# Patient Record
Sex: Male | Born: 1965 | Race: White | Hispanic: No | Marital: Married | Smoking: Never smoker
Health system: Southern US, Community
[De-identification: ages and names within clinical notes are randomized; demographics above are authoritative.]

## PROBLEM LIST (undated history)

## (undated) DIAGNOSIS — I10 Essential (primary) hypertension: Secondary | ICD-10-CM

## (undated) DIAGNOSIS — E785 Hyperlipidemia, unspecified: Secondary | ICD-10-CM

## (undated) DIAGNOSIS — I214 Non-ST elevation (NSTEMI) myocardial infarction: Secondary | ICD-10-CM

## (undated) DIAGNOSIS — I2510 Atherosclerotic heart disease of native coronary artery without angina pectoris: Secondary | ICD-10-CM

## (undated) DIAGNOSIS — R011 Cardiac murmur, unspecified: Secondary | ICD-10-CM

## (undated) HISTORY — PX: TONSILLECTOMY: SUR1361

## (undated) HISTORY — DX: Non-ST elevation (NSTEMI) myocardial infarction: I21.4

---

## 2014-06-01 DIAGNOSIS — E785 Hyperlipidemia, unspecified: Secondary | ICD-10-CM

## 2014-06-01 HISTORY — DX: Hyperlipidemia, unspecified: E78.5

## 2014-06-26 DIAGNOSIS — I214 Non-ST elevation (NSTEMI) myocardial infarction: Secondary | ICD-10-CM

## 2014-06-26 HISTORY — DX: Non-ST elevation (NSTEMI) myocardial infarction: I21.4

## 2014-06-27 ENCOUNTER — Inpatient Hospital Stay (HOSPITAL_COMMUNITY)
Admission: AD | Admit: 2014-06-27 | Discharge: 2014-06-30 | DRG: 282 | Disposition: A | Discharge status: Home / Self Care | Payer: Managed Care, Other (non HMO) | Source: Other Acute Inpatient Hospital | Attending: Cardiology | Admitting: Cardiology

## 2014-06-27 ENCOUNTER — Encounter (HOSPITAL_COMMUNITY): Payer: Self-pay | Admitting: General Practice

## 2014-06-27 DIAGNOSIS — R011 Cardiac murmur, unspecified: Secondary | ICD-10-CM | POA: Diagnosis present

## 2014-06-27 DIAGNOSIS — I214 Non-ST elevation (NSTEMI) myocardial infarction: Principal | ICD-10-CM | POA: Diagnosis present

## 2014-06-27 DIAGNOSIS — I2510 Atherosclerotic heart disease of native coronary artery without angina pectoris: Secondary | ICD-10-CM | POA: Diagnosis present

## 2014-06-27 DIAGNOSIS — I252 Old myocardial infarction: Secondary | ICD-10-CM | POA: Diagnosis not present

## 2014-06-27 DIAGNOSIS — E875 Hyperkalemia: Secondary | ICD-10-CM | POA: Diagnosis present

## 2014-06-27 DIAGNOSIS — N289 Disorder of kidney and ureter, unspecified: Secondary | ICD-10-CM | POA: Diagnosis present

## 2014-06-27 DIAGNOSIS — E785 Hyperlipidemia, unspecified: Secondary | ICD-10-CM | POA: Diagnosis present

## 2014-06-27 DIAGNOSIS — I10 Essential (primary) hypertension: Secondary | ICD-10-CM | POA: Diagnosis present

## 2014-06-27 DIAGNOSIS — Z7982 Long term (current) use of aspirin: Secondary | ICD-10-CM | POA: Diagnosis not present

## 2014-06-27 DIAGNOSIS — R079 Chest pain, unspecified: Secondary | ICD-10-CM | POA: Diagnosis present

## 2014-06-27 HISTORY — DX: Essential (primary) hypertension: I10

## 2014-06-27 HISTORY — DX: Cardiac murmur, unspecified: R01.1

## 2014-06-27 HISTORY — DX: Atherosclerotic heart disease of native coronary artery without angina pectoris: I25.10

## 2014-06-27 HISTORY — DX: Hyperlipidemia, unspecified: E78.5

## 2014-06-27 LAB — MRSA PCR SCREENING: MRSA by PCR: NEGATIVE

## 2014-06-27 MED ORDER — ONDANSETRON HCL 4 MG/2ML IJ SOLN: 4.0000 mg | Freq: Four times a day (QID) | INTRAMUSCULAR | Status: DC | PRN

## 2014-06-27 MED ORDER — ACETAMINOPHEN 325 MG PO TABS: 650.0000 mg | ORAL_TABLET | ORAL | Status: DC | PRN

## 2014-06-27 MED ORDER — HEPARIN (PORCINE) IN NACL 100-0.45 UNIT/ML-% IJ SOLN
1500.0000 [IU]/h | INTRAMUSCULAR | Status: DC
  Administered 2014-06-28: 1250 [IU]/h via INTRAVENOUS
  Filled 2014-06-27 (×2): qty 250

## 2014-06-27 MED ORDER — METOPROLOL TARTRATE 12.5 MG HALF TABLET
12.5000 mg | ORAL_TABLET | Freq: Two times a day (BID) | ORAL | Status: DC
  Administered 2014-06-28 – 2014-06-30 (×6): 12.5 mg via ORAL
  Filled 2014-06-27 (×6): qty 1

## 2014-06-27 MED ORDER — SODIUM CHLORIDE 0.9 % IV SOLN
INTRAVENOUS | Status: DC
  Administered 2014-06-28: via INTRAVENOUS

## 2014-06-27 MED ORDER — ATORVASTATIN CALCIUM 80 MG PO TABS
80.0000 mg | ORAL_TABLET | Freq: Every day | ORAL | Status: DC
  Administered 2014-06-28 – 2014-06-29 (×3): 80 mg via ORAL
  Filled 2014-06-27 (×4): qty 1

## 2014-06-27 MED ORDER — ACTIVE PARTNERSHIP FOR HEALTH OF YOUR HEART BOOK
Freq: Once | Status: AC
  Administered 2014-06-28: 05:00:00
  Filled 2014-06-27: qty 1

## 2014-06-27 MED ORDER — ASPIRIN EC 81 MG PO TBEC
81.0000 mg | DELAYED_RELEASE_TABLET | Freq: Every day | ORAL | Status: DC
  Administered 2014-06-28 – 2014-06-29 (×2): 81 mg via ORAL
  Filled 2014-06-27 (×2): qty 1

## 2014-06-27 MED ORDER — NITROGLYCERIN 0.4 MG SL SUBL: 0.4000 mg | SUBLINGUAL_TABLET | SUBLINGUAL | Status: DC | PRN

## 2014-06-27 NOTE — H&P (Addendum)
History and Physical  Patient ID: Walter Wolf MRN: [id number], DOB: May 17, 1965 Date of Encounter: 06/27/2014, 10:51 PM Primary Physician: Ignatius Specking., MD Primary Cardiologist: None  Chief Complaint: Chest pain Reason for Admission: NSTEMI  HPI: Walter Wolf is a 49 y/o man with history of hypertension, who presented to the [facility] ED with a 2 day h/o lower chest and epigastric pain, radiating to both shoulders.  The pain initially began around 10 AM on 06/26/14 while he was at work.  He thought it was indigestions, and it gradually resolved on its own.  While walking to his house after doing some yard work later in the afternoon, he had recurrence of the pain, which he describes as pressure with accompanying nausea and pallor (per his wife).  This lasted for ~2 hours before abating on its own.  He continued to have vague discomfort the next morning, prompting him to see a nurse at his job.  He was referred to his PCP, who was concerned about possible cardiac etiology and sent the patient to the [facility] ED.  At [facility], the patient was noted to have a troponin of 8.9 and abnormal EKG, as well as a creatinine of 3.2 (unknown baseline).  Other notable results include a potassium of 6.4 and WBC of 11.3.  He was given ASA 325 mg x 1 and was started on a heparin infusion; no other interventions were performed.  [facility] cardiology was consulted via [facility] for transfer.  Patient denies any history of heart disease or kidney disease in the past.  He does not use any NSAIDs, taking only occasional acetaminophen for pain as well as then a drill for seasonal allergies.  He has not been on any antihypertensive medications for at least 3-4 years, stating that his blood pressure has been well controlled with lifestyle modifications.  ADDENDUM (06/28/14 at 0720): Renal function is normal at [facility].  Closer inspection of OSH records shows that another's patient's labs were sent with Mr.  Bogue, causing initial confusion regarding his renal function.  Past Medical History  Diagnosis Date  . Hypertension     hx, "not on pills anymore" (06/27/2014)  . Heart murmur     "when I was younger"  . Myocardial infarction 06/26/2014     Most Recent Cardiac Studies: None   Surgical History:  Past Surgical History  Procedure Laterality Date  . Tonsillectomy  ~ 1973     Home Meds: Prior to Admission medications   Not on File    Allergies: Allergies not on file  History   Social History  . Marital Status: Married    Spouse Name: N/A  . Number of Children: N/A  . Years of Education: N/A   Occupational History  . Not on file.   Social History Main Topics  . Smoking status: Never Smoker   . Smokeless tobacco: Never Used  . Alcohol Use: No  . Drug Use: No  . Sexual Activity: Yes   Other Topics Concern  . Not on file   Social History Narrative  . No narrative on file     History reviewed. No pertinent family history.  Review of Systems: A 12-system review of systems was negative except as noted in the HPI.  Labs: Pending  Radiology/Studies:  No results found. Wt Readings from Last 3 Encounters:  06/27/14 97 kg (213 lb 13.5 oz)    EKG: Sinus rhythm with incomplete right bundle branch block, leftward axis, and poor R-wave progression.  No peaked T waves or PR prolongation.  No prior tracing available for comparison.  Physical Exam: Blood pressure 135/106, pulse 71, temperature 98.8 F (37.1 C), temperature source Oral, resp. rate 19, height 5\' 10"  (1.778 m), weight 97 kg (213 lb 13.5 oz), SpO2 98 %. General: Well developed, well nourished, in no acute distress.  His wife is at the bedside. Head: Normocephalic, atraumatic, sclera non-icteric, no xanthomas, nares are without discharge.  Neck: Negative for carotid bruits. JVD not elevated. Lungs: Clear bilaterally to auscultation without wheezes, rales, or rhonchi. Breathing is unlabored. Heart: RRR  with S1 S2. No murmurs, rubs, or gallops appreciated. Abdomen: Soft, non-tender, non-distended with normoactive bowel sounds. No hepatomegaly. No rebound/guarding. No obvious abdominal masses. Msk:  Strength and tone appear normal for age. Extremities: No clubbing or cyanosis. No edema.  Distal pedal pulses are 2+ and equal bilaterally. Neuro: Alert and oriented X 3. No focal deficit. No facial asymmetry. Moves all extremities spontaneously. Psych:  Responds to questions appropriately with a normal affect.    ASSESSMENT AND PLAN  49 year old man with history of hypertension, transferred for NSTEMI and renal insufficiency of uncertain chronicity.   NSTEMI:  Patient is currently asymptomatic .  His EKG demonstrates evidence of possible anterior infarct.  There are no ST segment deviations to suggest active ongoing ischemia.  His troponin at the outside hospital was notably elevated above 8.  We will manage him medically overnight and plan for coronary angiography in the morning (unless he has recurrent chest pain refractory to medical management).   - Trend Tn q6 hours until peaked - Heparin infusion - ASA 81 mg daily - Atorvastatin 80 mg daily - Metoprolol tartrate 12.5 mg BID, titrate up as heart rate and blood pressure allow - NPO for possible LHC in the morning. - Echocardiogram - Lipid panel and hemoglobin A1c - Monitor on telemetry  Renal insufficiency:  Initial concern based on OSH records, which included values for another patient.  Renal function is normal.  Continue IV hydration in anticipation of LHC.  Hyperkalemia:  Initial concern based on OSH records, which included values for another patient.  Renal function is normal.  Continue IV hydration in anticipation of LHC.  Diet: - NPO for possible LHC  Prophylaxis: - Heparin infusion - Protonix  Code status: - Full code   Signed, Tracker Mance A. MD 06/27/2014, 10:51 PM Pager: [phone]

## 2014-06-27 NOTE — Progress Notes (Signed)
ANTICOAGULATION CONSULT NOTE - Initial Consult  Pharmacy Consult for heparin Indication: NSTEMI  No Known Allergies  Patient Measurements: Height: 5\' 10"  (177.8 cm) Weight: 213 lb 13.5 oz (97 kg) IBW/kg (Calculated) : 73 Heparin Dosing Weight: 95kg  Vital Signs: Temp: 98.6 F (37 C) (04/27 2324) Temp Source: Oral (04/27 2324) BP: 137/82 mmHg (04/27 2324) Pulse Rate: 65 (04/27 2324)   Medical History: Past Medical History  Diagnosis Date  . Hypertension     hx, "not on pills anymore" (06/27/2014)  . Heart murmur     "when I was younger"  . Myocardial infarction 06/26/2014     Assessment: 48yo male [facility] c/o 2d of lower chest/epigastric pain radiating to back and left shoulder, troponin found to be 8.9, tx'd [facility] for w/u of NSTEMI, to continue heparin already started.  Labs at OSH: SCr 3.2, K 6.4, WBC 11.3  Goal of Therapy:  Heparin level 0.3-0.7 units/ml Monitor platelets by anticoagulation protocol: Yes   Plan:  OSH started heparin gtt at 1240 units/hr at 1925; will continue at 1250 units/hr and monitor heparin levels and CBC.  Vernard GamblesVeronda Naydene Kamrowski, PharmD, BCPS  06/27/2014,11:37 PM

## 2014-06-28 ENCOUNTER — Encounter (HOSPITAL_COMMUNITY): Payer: Self-pay | Admitting: Internal Medicine

## 2014-06-28 ENCOUNTER — Encounter (HOSPITAL_COMMUNITY)
Admission: AD | Disposition: A | Discharge status: Home / Self Care | Payer: Self-pay | Source: Other Acute Inpatient Hospital | Attending: Cardiology

## 2014-06-28 DIAGNOSIS — I10 Essential (primary) hypertension: Secondary | ICD-10-CM | POA: Diagnosis present

## 2014-06-28 DIAGNOSIS — E785 Hyperlipidemia, unspecified: Secondary | ICD-10-CM

## 2014-06-28 DIAGNOSIS — R079 Chest pain, unspecified: Secondary | ICD-10-CM

## 2014-06-28 DIAGNOSIS — R011 Cardiac murmur, unspecified: Secondary | ICD-10-CM | POA: Diagnosis present

## 2014-06-28 HISTORY — PX: LEFT HEART CATHETERIZATION WITH CORONARY ANGIOGRAM: SHX5451

## 2014-06-28 LAB — CBC WITH DIFFERENTIAL/PLATELET
BASOS PCT: 1 % (ref 0–1)
Basophils Absolute: 0.1 10*3/uL (ref 0.0–0.1)
Eosinophils Absolute: 0.2 10*3/uL (ref 0.0–0.7)
Eosinophils Relative: 1 % (ref 0–5)
HCT: 43.5 % (ref 39.0–52.0)
HEMOGLOBIN: 15.1 g/dL (ref 13.0–17.0)
LYMPHS ABS: 1.7 10*3/uL (ref 0.7–4.0)
Lymphocytes Relative: 17 % (ref 12–46)
MCH: 30.6 pg (ref 26.0–34.0)
MCHC: 34.7 g/dL (ref 30.0–36.0)
MCV: 88.1 fL (ref 78.0–100.0)
Monocytes Absolute: 1 10*3/uL (ref 0.1–1.0)
Monocytes Relative: 10 % (ref 3–12)
Neutro Abs: 7.5 10*3/uL (ref 1.7–7.7)
Neutrophils Relative %: 71 % (ref 43–77)
PLATELETS: 209 10*3/uL (ref 150–400)
RBC: 4.94 MIL/uL (ref 4.22–5.81)
RDW: 13.6 % (ref 11.5–15.5)
WBC: 10.5 10*3/uL (ref 4.0–10.5)

## 2014-06-28 LAB — TROPONIN I
TROPONIN I: 6.16 ng/mL — AB (ref <0.031)
TROPONIN I: 6.1 ng/mL — AB (ref <0.031)
Troponin I: 9.43 ng/mL (ref <0.031)

## 2014-06-28 LAB — CBC
HCT: 42.1 % (ref 39.0–52.0)
Hemoglobin: 14.2 g/dL (ref 13.0–17.0)
MCH: 30 pg (ref 26.0–34.0)
MCHC: 33.7 g/dL (ref 30.0–36.0)
MCV: 88.8 fL (ref 78.0–100.0)
Platelets: 199 10*3/uL (ref 150–400)
RBC: 4.74 MIL/uL (ref 4.22–5.81)
RDW: 13.6 % (ref 11.5–15.5)
WBC: 11 10*3/uL — ABNORMAL HIGH (ref 4.0–10.5)

## 2014-06-28 LAB — URINALYSIS, ROUTINE W REFLEX MICROSCOPIC
Bilirubin Urine: NEGATIVE
Glucose, UA: NEGATIVE mg/dL
HGB URINE DIPSTICK: NEGATIVE
Ketones, ur: NEGATIVE mg/dL
Leukocytes, UA: NEGATIVE
NITRITE: NEGATIVE
PH: 7 (ref 5.0–8.0)
Protein, ur: NEGATIVE mg/dL
SPECIFIC GRAVITY, URINE: 1.016 (ref 1.005–1.030)
Urobilinogen, UA: 0.2 mg/dL (ref 0.0–1.0)

## 2014-06-28 LAB — LIPID PANEL
CHOL/HDL RATIO: 6 ratio
CHOLESTEROL: 181 mg/dL (ref 0–200)
HDL: 30 mg/dL — AB (ref >39)
LDL Cholesterol: 118 mg/dL — ABNORMAL HIGH (ref 0–99)
Triglycerides: 163 mg/dL — ABNORMAL HIGH (ref <150)
VLDL: 33 mg/dL (ref 0–40)

## 2014-06-28 LAB — COMPREHENSIVE METABOLIC PANEL
ALBUMIN: 3.5 g/dL (ref 3.5–5.2)
ALK PHOS: 57 U/L (ref 39–117)
ALT: 32 U/L (ref 0–53)
ANION GAP: 9 (ref 5–15)
AST: 64 U/L — ABNORMAL HIGH (ref 0–37)
BUN: 11 mg/dL (ref 6–23)
CO2: 24 mmol/L (ref 19–32)
Calcium: 9 mg/dL (ref 8.4–10.5)
Chloride: 106 mmol/L (ref 96–112)
Creatinine, Ser: 0.92 mg/dL (ref 0.50–1.35)
GFR calc non Af Amer: >90 mL/min (ref >90)
Glucose, Bld: 124 mg/dL — ABNORMAL HIGH (ref 70–99)
Potassium: 3.6 mmol/L (ref 3.5–5.1)
SODIUM: 139 mmol/L (ref 135–145)
Total Bilirubin: 0.7 mg/dL (ref 0.3–1.2)
Total Protein: 6.5 g/dL (ref 6.0–8.3)

## 2014-06-28 LAB — PROTIME-INR
INR: 1.11 (ref 0.00–1.49)
Prothrombin Time: 14.5 seconds (ref 11.6–15.2)

## 2014-06-28 LAB — MAGNESIUM: Magnesium: 1.9 mg/dL (ref 1.5–2.5)

## 2014-06-28 LAB — APTT: APTT: 46 s — AB (ref 24–37)

## 2014-06-28 LAB — HEPARIN LEVEL (UNFRACTIONATED)
Heparin Unfractionated: 0.19 IU/mL — ABNORMAL LOW (ref 0.30–0.70)
Heparin Unfractionated: 0.54 IU/mL (ref 0.30–0.70)

## 2014-06-28 LAB — CK: CK TOTAL: 605 U/L — AB (ref 7–232)

## 2014-06-28 SURGERY — LEFT HEART CATHETERIZATION WITH CORONARY ANGIOGRAM

## 2014-06-28 MED ORDER — HEPARIN SODIUM (PORCINE) 1000 UNIT/ML IJ SOLN
INTRAMUSCULAR | Status: AC
  Filled 2014-06-28: qty 1

## 2014-06-28 MED ORDER — ASPIRIN 81 MG PO CHEW
81.0000 mg | CHEWABLE_TABLET | Freq: Every day | ORAL | Status: DC
  Administered 2014-06-30: 09:00:00 81 mg via ORAL
  Filled 2014-06-28: qty 1

## 2014-06-28 MED ORDER — NITROGLYCERIN 1 MG/10 ML FOR IR/CATH LAB
INTRA_ARTERIAL | Status: AC
  Filled 2014-06-28: qty 10

## 2014-06-28 MED ORDER — SODIUM CHLORIDE 0.9 % IV SOLN: INTRAVENOUS | Status: DC

## 2014-06-28 MED ORDER — HEPARIN BOLUS VIA INFUSION
3000.0000 [IU] | Freq: Once | INTRAVENOUS | Status: AC
  Administered 2014-06-28: 3000 [IU] via INTRAVENOUS
  Filled 2014-06-28: qty 3000

## 2014-06-28 MED ORDER — VERAPAMIL HCL 2.5 MG/ML IV SOLN
INTRAVENOUS | Status: AC
  Filled 2014-06-28: qty 2

## 2014-06-28 MED ORDER — LIDOCAINE HCL (PF) 1 % IJ SOLN
INTRAMUSCULAR | Status: AC
  Filled 2014-06-28: qty 30

## 2014-06-28 MED ORDER — HEPARIN (PORCINE) IN NACL 2-0.9 UNIT/ML-% IJ SOLN
INTRAMUSCULAR | Status: AC
  Filled 2014-06-28: qty 1500

## 2014-06-28 MED ORDER — MORPHINE SULFATE 2 MG/ML IJ SOLN: 2.0000 mg | INTRAMUSCULAR | Status: DC | PRN

## 2014-06-28 MED ORDER — ONDANSETRON HCL 4 MG/2ML IJ SOLN: 4.0000 mg | Freq: Four times a day (QID) | INTRAMUSCULAR | Status: DC | PRN

## 2014-06-28 MED ORDER — SODIUM CHLORIDE 0.9 % IV SOLN
INTRAVENOUS | Status: DC
  Administered 2014-06-28: 100 mL/h via INTRAVENOUS

## 2014-06-28 MED ORDER — ACETAMINOPHEN 325 MG PO TABS: 650.0000 mg | ORAL_TABLET | ORAL | Status: DC | PRN

## 2014-06-28 MED FILL — Heparin Sodium (Porcine) 100 Unt/ML in Sodium Chloride 0.45%: INTRAMUSCULAR | Qty: 250 | Status: AC

## 2014-06-28 NOTE — CV Procedure (Signed)
Walter Wolf is a 49 y.o. male    [id number] LOCATION:  FACILITY: [facility]  PHYSICIAN: Nanetta BattyJonathan Tiernan Millikin, M.D. 10/29/1965   DATE OF PROCEDURE:  06/28/2014  DATE OF DISCHARGE:     CARDIAC CATHETERIZATION     History obtained from chart review. Walter Wolf is a 49 year old thin and fit-appearing married Caucasian male father of 2 children who works at [employer]. He has minimal chronic risk factors other than hypertension and family history. He developed chest pain/epigastric pain the night before [facility] and [facility]. He had no acute EKG changes. His enzymes did peak troponin of approximately 8. He has not had chest pain in 24 hours. He presents now for diagnostic coronary arteriography.   PROCEDURE DESCRIPTION:   The patient was brought to the second floor [facility] cath lab in the postabsorptive state. He was not premedicated . I initially tried to obtain access in his right radial artery unsuccessfully. His right groinwas prepped and shaved in usual sterile fashion. Xylocaine 1% was used for local anesthesia. A 5 French sheath was inserted into the right common femoral artery using standard Seldinger technique. 5 French right and left Judkins trimester catheters along with a 5 French pigtail catheter were used for selective coronary angiography and left ventriculography respectively. Visipaque dye was used for the entirety of the case (55 mL administered the patient). Retrograde aortic, left ventricular pullback pressures were recorded.   HEMODYNAMICS:    AO SYSTOLIC/AO DIASTOLIC: 142/84   LV SYSTOLIC/LV DIASTOLIC: 146/15  ANGIOGRAPHIC RESULTS:   1. Left main; normal  2. LAD; normal 3. Left circumflex; nondominant and normal.  There was a large posterior lateral branch. 4. Right coronary artery; dominant with a small PDA and occluded PLA beyond this. The PLA filled by grade 1 left-to-right collaterals. 5. Left ventriculography; RAO  left ventriculogram was performed using  25 mL of Visipaque dye at 12 mL/second. The overall LVEF estimated  50-55 %  With wall motion abnormalities notable for subtle inferior hypokinesia  IMPRESSION:Walter Wolf has an occluded posterior lateral branch, grade 1 left right collaterals with preserved LV function. He is asymptomatic. At this point, there is no indication to perform percutaneous revascularization. Medical therapy will be recommended. A femoral angiogram was performed and a MYNX closure device was used to obtain hemostasis. The patient left the lab in stable condition  Walter Wolf,Walter Stavely J. MD, [facility] 06/28/2014 4:59 PM

## 2014-06-28 NOTE — Progress Notes (Signed)
ANTICOAGULATION CONSULT NOTE - Follow Up Consult  Pharmacy Consult for heparin Indication: NSTEMI  Labs:  Recent Labs  06/28/14 0144 06/28/14 0336  HGB 15.1 14.2  HCT 43.5 42.1  PLT 209 199  APTT 46*  --   LABPROT 14.5  --   INR 1.11  --   HEPARINUNFRC  --  0.19*  CREATININE 0.92  --   CKTOTAL 605*  --   TROPONINI 9.43*  --       Assessment: 49yo male subtherapeutic on heparin with initial dosing for CP; of note SCr drawn this am is 0.92, ?error at OSH lab on prior.  Goal of Therapy:  Heparin level 0.3-0.7 units/ml   Plan:  Will rebolus with heparin 3000 units and increase gtt by 2-3 units/kg/hr to 1500 units/hr and check level in 6hr.  Vernard GamblesVeronda Khing Belcher, PharmD, BCPS  06/28/2014,5:04 AM

## 2014-06-28 NOTE — Progress Notes (Signed)
CRITICAL VALUE ALERT  Critical value received:  Troponin 9.43  Date of notification:  06/28/2014  Time of notification:  03:10  Critical value read back:Yes.    Nurse who received alert:  Toney Sangeresa Shareef Eddinger RN  MD notified (1st page):  Dr. Okey DupreEnd  Time of first page:  03:12  MD notified (2nd page):  Time of second page:  Responding MD:  Dr. Okey DupreEnd - no new orders received.  Time MD responded:  03:12

## 2014-06-28 NOTE — Interval H&P Note (Signed)
Cath Lab Visit (complete for each Cath Lab visit)  Clinical Evaluation Leading to the Procedure:   ACS: Yes.    Non-ACS:    Anginal Classification: CCS IV  Anti-ischemic medical therapy: No Therapy  Non-Invasive Test Results: No non-invasive testing performed  Prior CABG: No previous CABG      History and Physical Interval Note:  06/28/2014 4:06 PM  Walter Wolf  has presented today for surgery, with the diagnosis of cp  The various methods of treatment have been discussed with the patient and family. After consideration of risks, benefits and other options for treatment, the patient has consented to  Procedure(s): LEFT HEART CATHETERIZATION WITH CORONARY ANGIOGRAM (N/A) as a surgical intervention .  The patient's history has been reviewed, patient examined, no change in status, stable for surgery.  I have reviewed the patient's chart and labs.  Questions were answered to the patient's satisfaction.     Runell GessBERRY,JONATHAN J

## 2014-06-28 NOTE — Progress Notes (Signed)
  Echocardiogram 2D Echocardiogram has been performed.  Leta JunglingCooper, Aja Bolander M 06/28/2014, 10:52 AM

## 2014-06-28 NOTE — Progress Notes (Signed)
   Patient Name: Walter Wolf Date of Encounter: 06/28/2014  Principal Problem:   NSTEMI (non-ST elevated myocardial infarction) Active Problems:   Dyslipidemia (high LDL; low HDL)   Heart murmur   Hypertension   Primary Cardiologist: New, prefers follow-up in [place] since he works here  Patient Profile: 49-year-old male with cardiac risk factors including family history but no known CAD was admitted 04/27 from [facility] with a non-STEMI  SUBJECTIVE: No chest pain or shortness of breath overnight  OBJECTIVE Filed Vitals:   06/27/14 2324 06/28/14 0013 06/28/14 0500 06/28/14 0627  BP: 137/82   110/67  Pulse: 65   68  Temp: 98.6 F (37 C)   98.4 F (36.9 C)  TempSrc: Oral   Oral  Resp: 17   20  Height:      Weight:  213 lb 13.5 oz (97 kg) 213 lb 13.5 oz (97 kg)   SpO2: 99%   96%    Intake/Output Summary (Last 24 hours) at 06/28/14 0828 Last data filed at 06/28/14 0627  Gross per 24 hour  Intake      0 ml  Output   1000 ml  Net  -1000 ml   Filed Weights   06/27/14 2100 06/28/14 0013 06/28/14 0500  Weight: 213 lb 13.5 oz (97 kg) 213 lb 13.5 oz (97 kg) 213 lb 13.5 oz (97 kg)    PHYSICAL EXAM General: Well developed, well nourished, male in no acute distress. Head: Normocephalic, atraumatic.  Neck: Supple without bruits, JVD not elevated. Lungs:  Resp regular and unlabored, CTA. Heart: RRR, S1, S2, no S3, S4, 2/6 murmur; no rub. Abdomen: Soft, non-tender, non-distended, BS + x 4.  Extremities: No clubbing, cyanosis, no edema.  Neuro: Alert and oriented X 3. Moves all extremities spontaneously. Psych: Normal affect.  LABS: CBC:  Recent Labs  06/28/14 0144 06/28/14 0336  WBC 10.5 11.0*  NEUTROABS 7.5  --   HGB 15.1 14.2  HCT 43.5 42.1  MCV 88.1 88.8  PLT 209 199   INR:  Recent Labs  06/28/14 0144  INR 1.11   Basic Metabolic Panel:  Recent Labs  06/28/14 0144  NA 139  K 3.6  CL 106  CO2 24  GLUCOSE 124*  BUN 11  CREATININE 0.92    CALCIUM 9.0  MG 1.9   Liver Function Tests:  Recent Labs  06/28/14 0144  AST 64*  ALT 32  ALKPHOS 57  BILITOT 0.7  PROT 6.5  ALBUMIN 3.5   Cardiac Enzymes:  Recent Labs  06/28/14 0144 06/28/14 0336  CKTOTAL 605*  --   TROPONINI 9.43* 6.16*   Fasting Lipid Panel:  Recent Labs  06/28/14 0336  CHOL 181  HDL 30*  LDLCALC 118*  TRIG 163*  CHOLHDL 6.0    TELE:   Sinus rhythm, no significant ectopy     ECG: Sinus rhythm, no acute ischemic changes  Current Medications:  . aspirin EC  81 mg Oral Daily  . atorvastatin  80 mg Oral q1800  . metoprolol tartrate  12.5 mg Oral BID   . heparin 1,500 Units/hr (06/28/14 0700)    ASSESSMENT AND PLAN: Principal Problem:   NSTEMI (non-ST elevated myocardial infarction) - Cath today, patient on board and orders written - Continue aspirin, statin and beta blocker  Active Problems:   Dyslipidemia (high LDL; low HDL) - Lipitor 80 as new, LFTs are okay    Heart murmur - Patient has had all his life - Echo was ordered, follow-up   on results    Hypertension - Good control on current Rx    Possible renal insufficiency - Believe this was a lab error, renal function is normal now, okay to decrease IVF to 75 mL/h  Plan Today, follow-up on results, okay to have clear liquids this a.m.  Signed, Rhonda Barrett , PA-C 8:28 AM 06/28/2014  Patient seen, examined. Available data reviewed. Agree with findings, assessment, and plan as outlined by Rhonda Barrett, PA-C. Rate and rhythm. Lungs are clear. Radial pulses are 2+ and equal. There is no peripheral edema. EKG is nondiagnostic. Troponins are trending down. The patient had about 10 hours of continuous chest pain now greater than 24 hours ago and he's been chest pain-free since then. Plans reviewed for cardiac catheterization and possible PCI today.  I have reviewed the risks, indications, and alternatives to cardiac catheterization and PCI  with the patient and his wife who  is at the bedside. Risks include but are not limited to bleeding, infection, vascular injury, stroke, myocardial infection, arrhythmia, kidney injury, radiation-related injury in the case of prolonged fluoroscopy use, emergency cardiac surgery, and death. The patient understands the risks of serious complication is low (<1%).   Ihor Meinzer, M.D. 06/28/2014 2:17 PM  

## 2014-06-28 NOTE — H&P (View-Only) (Signed)
Patient Name: Walter Wolf Date of Encounter: 06/28/2014  Principal Problem:   NSTEMI (non-ST elevated myocardial infarction) Active Problems:   Dyslipidemia (high LDL; low HDL)   Heart murmur   Hypertension   Primary Cardiologist: New, prefers follow-up in [place] since he works here  Patient Profile: 49 year old male with cardiac risk factors including family history but no known CAD was admitted 04/27 from [place] with a non-STEMI  SUBJECTIVE: No chest pain or shortness of breath overnight  OBJECTIVE Filed Vitals:   06/27/14 2324 06/28/14 0013 06/28/14 0500 06/28/14 0627  BP: 137/82   110/67  Pulse: 65   68  Temp: 98.6 F (37 C)   98.4 F (36.9 C)  TempSrc: Oral   Oral  Resp: 17   20  Height:      Weight:  213 lb 13.5 oz (97 kg) 213 lb 13.5 oz (97 kg)   SpO2: 99%   96%    Intake/Output Summary (Last 24 hours) at 06/28/14 0828 Last data filed at 06/28/14 4098  Gross per 24 hour  Intake      0 ml  Output   1000 ml  Net  -1000 ml   Filed Weights   06/27/14 2100 06/28/14 0013 06/28/14 0500  Weight: 213 lb 13.5 oz (97 kg) 213 lb 13.5 oz (97 kg) 213 lb 13.5 oz (97 kg)    PHYSICAL EXAM General: Well developed, well nourished, male in no acute distress. Head: Normocephalic, atraumatic.  Neck: Supple without bruits, JVD not elevated. Lungs:  Resp regular and unlabored, CTA. Heart: RRR, S1, S2, no S3, S4, 2/6 murmur; no rub. Abdomen: Soft, non-tender, non-distended, BS + x 4.  Extremities: No clubbing, cyanosis, no edema.  Neuro: Alert and oriented X 3. Moves all extremities spontaneously. Psych: Normal affect.  LABS: CBC:  Recent Labs  06/28/14 0144 06/28/14 0336  WBC 10.5 11.0*  NEUTROABS 7.5  --   HGB 15.1 14.2  HCT 43.5 42.1  MCV 88.1 88.8  PLT 209 199   INR:  Recent Labs  06/28/14 0144  INR 1.11   Basic Metabolic Panel:  Recent Labs  11/91/47 0144  NA 139  K 3.6  CL 106  CO2 24  GLUCOSE 124*  BUN 11  CREATININE 0.92    CALCIUM 9.0  MG 1.9   Liver Function Tests:  Recent Labs  06/28/14 0144  AST 64*  ALT 32  ALKPHOS 57  BILITOT 0.7  PROT 6.5  ALBUMIN 3.5   Cardiac Enzymes:  Recent Labs  06/28/14 0144 06/28/14 0336  CKTOTAL 605*  --   TROPONINI 9.43* 6.16*   Fasting Lipid Panel:  Recent Labs  06/28/14 0336  CHOL 181  HDL 30*  LDLCALC 118*  TRIG 163*  CHOLHDL 6.0    TELE:   Sinus rhythm, no significant ectopy     ECG: Sinus rhythm, no acute ischemic changes  Current Medications:  . aspirin EC  81 mg Oral Daily  . atorvastatin  80 mg Oral q1800  . metoprolol tartrate  12.5 mg Oral BID   . heparin 1,500 Units/hr (06/28/14 0700)    ASSESSMENT AND PLAN: Principal Problem:   NSTEMI (non-ST elevated myocardial infarction) - Cath today, patient on board and orders written - Continue aspirin, statin and beta blocker  Active Problems:   Dyslipidemia (high LDL; low HDL) - Lipitor 80 as new, LFTs are okay    Heart murmur - Patient has had all his life - Echo was ordered, follow-up  on results    Hypertension - Good control on current Rx    Possible renal insufficiency - Believe this was a lab error, renal function is normal now, okay to decrease IVF to 75 mL/h  Plan Today, follow-up on results, okay to have clear liquids this a.m.  Signed, Theodore Demarkhonda Barrett , PA-C 8:28 AM 06/28/2014  Patient seen, examined. Available data reviewed. Agree with findings, assessment, and plan as outlined by Theodore Demarkhonda Barrett, PA-C. Rate and rhythm. Lungs are clear. Radial pulses are 2+ and equal. There is no peripheral edema. EKG is nondiagnostic. Troponins are trending down. The patient had about 10 hours of continuous chest pain now greater than 24 hours ago and he's been chest pain-free since then. Plans reviewed for cardiac catheterization and possible PCI today.  I have reviewed the risks, indications, and alternatives to cardiac catheterization and PCI  with the patient and his wife who  is at the bedside. Risks include but are not limited to bleeding, infection, vascular injury, stroke, myocardial infection, arrhythmia, kidney injury, radiation-related injury in the case of prolonged fluoroscopy use, emergency cardiac surgery, and death. The patient understands the risks of serious complication is low (<1%).   Tonny BollmanMichael Rishon Thilges, M.D. 06/28/2014 2:17 PM

## 2014-06-28 NOTE — Progress Notes (Signed)
ANTICOAGULATION CONSULT  Pharmacy Consult for heparin Indication: NSTEMI  No Known Allergies  Patient Measurements: Height: 5\' 10"  (177.8 cm) Weight: 213 lb 13.5 oz (97 kg) IBW/kg (Calculated) : 73 Heparin Dosing Weight: 95kg  Vital Signs: Temp: 98.2 F (36.8 C) (04/28 0839) Temp Source: Oral (04/28 0839) BP: 117/76 mmHg (04/28 0839) Pulse Rate: 72 (04/28 0839)   Medical History: Past Medical History  Diagnosis Date  . Hypertension   . Heart murmur   . Myocardial infarction 06/26/2014  . Dyslipidemia (high LDL; low HDL) 06/2014     Assessment: 48yo male [facility] c/o 2d of lower chest/epigastric pain radiating to back and left shoulder, troponin found to be 8.9, [facility] of NSTEMI, to continue heparin already started. HL therapeutic at 0.54 after rate increased to 1500 units/hr.  CBC WNL.  No bleeding reported.  Cath scheduled for today  Goal of Therapy:  Heparin level 0.3-0.7 units/ml Monitor platelets by anticoagulation protocol: Yes   Plan:  -continue heparin at 1500 units/hr -daily HL and CBC while on heparin -f/u p cath  Herby AbrahamMichelle T. Cully Luckow, Pharm.D. [phone] 06/28/2014 1:42 PM

## 2014-06-29 ENCOUNTER — Encounter (HOSPITAL_COMMUNITY): Payer: Self-pay | Admitting: Cardiovascular Disease

## 2014-06-29 LAB — HEMOGLOBIN A1C
Hgb A1c MFr Bld: 5.8 % — ABNORMAL HIGH (ref 4.8–5.6)
Mean Plasma Glucose: 120 mg/dL

## 2014-06-29 LAB — CBC
HEMATOCRIT: 41.3 % (ref 39.0–52.0)
HEMOGLOBIN: 13.9 g/dL (ref 13.0–17.0)
MCH: 29.8 pg (ref 26.0–34.0)
MCHC: 33.7 g/dL (ref 30.0–36.0)
MCV: 88.6 fL (ref 78.0–100.0)
Platelets: 207 10*3/uL (ref 150–400)
RBC: 4.66 MIL/uL (ref 4.22–5.81)
RDW: 13.5 % (ref 11.5–15.5)
WBC: 9.6 10*3/uL (ref 4.0–10.5)

## 2014-06-29 LAB — BASIC METABOLIC PANEL
ANION GAP: 8 (ref 5–15)
BUN: 11 mg/dL (ref 6–23)
CO2: 25 mmol/L (ref 19–32)
CREATININE: 1.15 mg/dL (ref 0.50–1.35)
Calcium: 8.5 mg/dL (ref 8.4–10.5)
Chloride: 105 mmol/L (ref 96–112)
GFR, EST AFRICAN AMERICAN: 84 mL/min — AB (ref >90)
GFR, EST NON AFRICAN AMERICAN: 73 mL/min — AB (ref >90)
Glucose, Bld: 106 mg/dL — ABNORMAL HIGH (ref 70–99)
Potassium: 3.9 mmol/L (ref 3.5–5.1)
Sodium: 138 mmol/L (ref 135–145)

## 2014-06-29 MED ORDER — CLOPIDOGREL BISULFATE 75 MG PO TABS
75.0000 mg | ORAL_TABLET | Freq: Every day | ORAL | Status: DC
  Administered 2014-06-29 – 2014-06-30 (×2): 75 mg via ORAL
  Filled 2014-06-29 (×2): qty 1

## 2014-06-30 ENCOUNTER — Encounter (HOSPITAL_COMMUNITY): Payer: Self-pay | Admitting: Physician Assistant

## 2014-06-30 DIAGNOSIS — I214 Non-ST elevation (NSTEMI) myocardial infarction: Principal | ICD-10-CM

## 2014-06-30 MED ORDER — CLOPIDOGREL BISULFATE 75 MG PO TABS: 75.0000 mg | ORAL_TABLET | Freq: Every day | ORAL | Status: DC

## 2014-06-30 MED ORDER — ASPIRIN 81 MG PO CHEW: 81.0000 mg | CHEWABLE_TABLET | Freq: Every day | ORAL | Status: AC

## 2014-06-30 MED ORDER — LISINOPRIL 2.5 MG PO TABS: 2.5000 mg | ORAL_TABLET | Freq: Every day | ORAL | Status: DC

## 2014-06-30 MED ORDER — METOPROLOL TARTRATE 25 MG PO TABS: 12.5000 mg | ORAL_TABLET | Freq: Two times a day (BID) | ORAL | Status: DC

## 2014-06-30 MED ORDER — NITROGLYCERIN 0.4 MG SL SUBL: 0.4000 mg | SUBLINGUAL_TABLET | SUBLINGUAL | Status: DC | PRN

## 2014-06-30 MED ORDER — ATORVASTATIN CALCIUM 80 MG PO TABS: 80.0000 mg | ORAL_TABLET | Freq: Every day | ORAL | Status: DC

## 2014-06-30 MED ORDER — LISINOPRIL 2.5 MG PO TABS
2.5000 mg | ORAL_TABLET | Freq: Every day | ORAL | Status: DC
  Administered 2014-06-30: 09:00:00 2.5 mg via ORAL
  Filled 2014-06-30: qty 1

## 2014-06-30 NOTE — Discharge Summary (Signed)
Discharge Summary   Patient ID: Walter Wolf,  MRN: [id number], DOB/AGE: January 21, 1966 49 y.o.  Admit date: 06/27/2014 Discharge date: 06/30/2014  Primary Care Provider: VYAS,DHRUV B. Primary Cardiologist: New - Dr. Excell Seltzer  Discharge Diagnoses Principal Problem:   NSTEMI (non-ST elevated myocardial infarction) Active Problems:   Dyslipidemia (high LDL; low HDL)   Heart murmur   Hypertension   Allergies No Known Allergies  Procedures  Echocardiogram 06/28/2014 LV EF: 55% -  60%  ------------------------------------------------------------------- Indications:   Chest pain 786.51.  ------------------------------------------------------------------- History:  PMH: NSTEMI. Murmur. Risk factors: Hypertension. Dyslipidemia.  ------------------------------------------------------------------- Study Conclusions  - Left ventricle: The cavity size was normal. Wall thickness was normal. Systolic function was normal. The estimated ejection fraction was in the range of 55% to 60%. - Aortic valve: Moderate thickening and calcification, consistent with sclerosis. - Mitral valve: There was trivial regurgitation. - Tricuspid valve: There was trivial regurgitation.     Cardiac catheterization 06/28/2014 HEMODYNAMICS:   AO SYSTOLIC/AO DIASTOLIC: 142/84  LV SYSTOLIC/LV DIASTOLIC: 146/15  ANGIOGRAPHIC RESULTS:   1. Left main; normal  2. LAD; normal 3. Left circumflex; nondominant and normal. There was a large posterior lateral branch. 4. Right coronary artery; dominant with a small PDA and occluded PLA beyond this. The PLA filled by grade 1 left-to-right collaterals. 5. Left ventriculography; RAO left ventriculogram was performed using  25 mL of Visipaque dye at 12 mL/second. The overall LVEF estimated  50-55 % With wall motion abnormalities notable for subtle inferior hypokinesia  IMPRESSION:Walter Wolf has an occluded posterior lateral branch, grade 1  left right collaterals with preserved LV function. He is asymptomatic. At this point, there is no indication to perform percutaneous revascularization. Medical therapy will be recommended. A femoral angiogram was performed and a MYNX closure device was used to obtain hemostasis. The patient left the lab in stable condition    Hospital Course  Walter Wolf is a 49 year old male with past medical history of hypertension who presented to [facility] ED with 2 day onset of lower chest and epigastric pain radiating to bilateral shoulders. The pain initially began around 10 AM on 06/26/2014 while he was at work. He initially thought was indigestion and it gradually resolved by itself. He had recurrence of the chest discomfort later that day and continued into the next morning. He was seen by his PCP who was concerned for possible cardiac etiology and send the patient to [facility] ED. While at [facility] ED, his troponin was 8.9 and had a creatinine of 3.2. Patient was given 325 mg of aspirin and started on heparin IV infusion before transferring to [facility] for cardiac catheterization. Of note, although patient had Cr of 3.2 at [facility], upon further review, this appears to be lab error (another patient's lab). Patient's renal function appears to be normal on repeat labs at [facility].  He underwent a scheduled cardiac catheterization on 06/28/2014 which showed EF 50-55%, dominant RCA with small occluded PLA with grade 1 left-to-right collaterals. Given the fact he was asymptomatic prior to cath, this was not treated with stenting. And medical management was recommended. Patient was placed on aspirin, Plavix, Lipitor, lisinopril and metoprolol. Echocardiogram obtained on the same day showed EF 55-60%, moderate thickening and calcification of aortic valve, trivial mitral regurg/tricuspid regurg. He was seen in the morning of 06/30/2014, at which time, his right groin cath site appears to be stable. He  denies any chest discomfort or shortness of breath. He is deemed stable for discharge from cardiology perspective.  He will need to establish follow-up with cardiology service.   Discharge Vitals Blood pressure 127/85, pulse 76, temperature 98.8 F (37.1 C), temperature source Oral, resp. rate 20, height 5\' 10"  (1.778 m), weight 212 lb 15.4 oz (96.6 kg), SpO2 95 %.  Filed Weights   06/28/14 0013 06/28/14 0500 06/29/14 0500  Weight: 213 lb 13.5 oz (97 kg) 213 lb 13.5 oz (97 kg) 212 lb 15.4 oz (96.6 kg)    Labs  CBC  Recent Labs  06/28/14 0144 06/28/14 0336 06/29/14 0309  WBC 10.5 11.0* 9.6  NEUTROABS 7.5  --   --   HGB 15.1 14.2 13.9  HCT 43.5 42.1 41.3  MCV 88.1 88.8 88.6  PLT 209 199 207   Basic Metabolic Panel  Recent Labs  06/28/14 0144 06/29/14 0309  NA 139 138  K 3.6 3.9  CL 106 105  CO2 24 25  GLUCOSE 124* 106*  BUN 11 11  CREATININE 0.92 1.15  CALCIUM 9.0 8.5  MG 1.9  --    Liver Function Tests  Recent Labs  06/28/14 0144  AST 64*  ALT 32  ALKPHOS 57  BILITOT 0.7  PROT 6.5  ALBUMIN 3.5   Cardiac Enzymes  Recent Labs  06/28/14 0144 06/28/14 0336 06/28/14 1150  CKTOTAL 605*  --   --   TROPONINI 9.43* 6.16* 6.10*   Hemoglobin A1C  Recent Labs  06/28/14 0144  HGBA1C 5.8*   Fasting Lipid Panel  Recent Labs  06/28/14 0336  CHOL 181  HDL 30*  LDLCALC 118*  TRIG 163*  CHOLHDL 6.0    Disposition  Pt is being discharged home today in good condition.  Follow-up Plans & Appointments      Follow-up Information    Follow up with Tonny BollmanMichael Cooper, MD.   Specialty:  Cardiology   Why:  Office scheduler will contact you within 2 business days to arrange followup. Please give us a call if you do not hear from us.    Contact information:   [address] [place] [address] [phone]       Discharge Medications    Medication List    TAKE these medications        aspirin 81 MG chewable tablet  Chew 1  tablet (81 mg total) by mouth daily.     atorvastatin 80 MG tablet  Commonly known as:  LIPITOR  Take 1 tablet (80 mg total) by mouth daily at 6 PM.     clopidogrel 75 MG tablet  Commonly known as:  PLAVIX  Take 1 tablet (75 mg total) by mouth daily.     diphenhydrAMINE 25 MG tablet  Commonly known as:  BENADRYL  Take 25 mg by mouth every 6 (six) hours as needed for allergies.     lisinopril 2.5 MG tablet  Commonly known as:  PRINIVIL,ZESTRIL  Take 1 tablet (2.5 mg total) by mouth daily.     metoprolol tartrate 25 MG tablet  Commonly known as:  LOPRESSOR  Take 0.5 tablets (12.5 mg total) by mouth 2 (two) times daily.     nitroGLYCERIN 0.4 MG SL tablet  Commonly known as:  NITROSTAT  Place 1 tablet (0.4 mg total) under the tongue every 5 (five) minutes x 3 doses as needed for chest pain.         Duration of Discharge Encounter   Greater than 30 minutes including physician time.  Ramond DialSigned, Myana Schlup PA-C Pager: [phone] 06/30/2014, 8:20 AM

## 2014-06-30 NOTE — Progress Notes (Signed)
Tech offered Pt a bath, Pt. Request to take a shower. RN aware.

## 2014-06-30 NOTE — Discharge Instructions (Signed)

## 2014-06-30 NOTE — Progress Notes (Signed)
[id number] Wrote order for Phase 1 when notified pt positive for NSTEMI. MI ed completed with pt and wife who voiced understanding. Did not walk as pt has been walking independently. Discussed with pt watching HGAIC as it is 5.8. Discussed ways to cut down on carbs and sugars. Discussed CRP 2 and pt declined due to work hours.  Gave heart healthy diet info. Luetta NuttingCharlene Karianne Nogueira RN BSN 06/30/2014 8:49 AM

## 2014-06-30 NOTE — Progress Notes (Signed)
Patient Name: Walter Wolf Date of Encounter: 06/30/2014  Primary Cardiologist: New - Dr. Excell Seltzerooper   Principal Problem:   NSTEMI (non-ST elevated myocardial infarction) Active Problems:   Dyslipidemia (high LDL; low HDL)   Heart murmur   Hypertension    SUBJECTIVE  Denies any CP for the past 48 hours, no SOB.   CURRENT MEDS . aspirin  81 mg Oral Daily  . atorvastatin  80 mg Oral q1800  . clopidogrel  75 mg Oral Daily  . metoprolol tartrate  12.5 mg Oral BID    OBJECTIVE  Filed Vitals:   06/29/14 1618 06/29/14 2048 06/30/14 0000 06/30/14 0400  BP: 127/91 131/83 128/80 127/87  Pulse: 85 90 78 77  Temp: 99 F (37.2 C) 99.6 F (37.6 C) 99.7 F (37.6 C) 99 F (37.2 C)  TempSrc: Oral Oral Oral Oral  Resp: 18  18 19   Height:      Weight:      SpO2: 98% 90% 98% 97%    Intake/Output Summary (Last 24 hours) at 06/30/14 0720 Last data filed at 06/29/14 2051  Gross per 24 hour  Intake    800 ml  Output   2100 ml  Net  -1300 ml   Filed Weights   06/28/14 0013 06/28/14 0500 06/29/14 0500  Weight: 213 lb 13.5 oz (97 kg) 213 lb 13.5 oz (97 kg) 212 lb 15.4 oz (96.6 kg)    PHYSICAL EXAM  General: Pleasant, NAD. Neuro: Alert and oriented X 3. Moves all extremities spontaneously. Psych: Normal affect. HEENT:  Normal  Neck: Supple without bruits or JVD. Lungs:  Resp regular and unlabored, CTA. Heart: RRR no s3, s4, or murmurs. R groin cath site stable.  Abdomen: Soft, non-tender, non-distended, BS + x 4.  Extremities: No clubbing, cyanosis or edema. DP/PT/Radials 2+ and equal bilaterally.  Accessory Clinical Findings  CBC  Recent Labs  06/28/14 0144 06/28/14 0336 06/29/14 0309  WBC 10.5 11.0* 9.6  NEUTROABS 7.5  --   --   HGB 15.1 14.2 13.9  HCT 43.5 42.1 41.3  MCV 88.1 88.8 88.6  PLT 209 199 207   Basic Metabolic Panel  Recent Labs  06/28/14 0144 06/29/14 0309  NA 139 138  K 3.6 3.9  CL 106 105  CO2 24 25  GLUCOSE 124* 106*  BUN 11 11    CREATININE 0.92 1.15  CALCIUM 9.0 8.5  MG 1.9  --    Liver Function Tests  Recent Labs  06/28/14 0144  AST 64*  ALT 32  ALKPHOS 57  BILITOT 0.7  PROT 6.5  ALBUMIN 3.5   Cardiac Enzymes  Recent Labs  06/28/14 0144 06/28/14 0336 06/28/14 1150  CKTOTAL 605*  --   --   TROPONINI 9.43* 6.16* 6.10*   Hemoglobin A1C  Recent Labs  06/28/14 0144  HGBA1C 5.8*   Fasting Lipid Panel  Recent Labs  06/28/14 0336  CHOL 181  HDL 30*  LDLCALC 118*  TRIG 163*  CHOLHDL 6.0    TELE NSR with HR 60-70s, occasional 90s    ECG  No new EKG  Echocardiogram 06/28/2014  LV EF: 55% -  60%  ------------------------------------------------------------------- Indications:   Chest pain 786.51.  ------------------------------------------------------------------- History:  PMH: NSTEMI. Murmur. Risk factors: Hypertension. Dyslipidemia.  ------------------------------------------------------------------- Study Conclusions  - Left ventricle: The cavity size was normal. Wall thickness was normal. Systolic function was normal. The estimated ejection fraction was in the range of 55% to 60%. - Aortic valve: Moderate thickening and  calcification, consistent with sclerosis. - Mitral valve: There was trivial regurgitation. - Tricuspid valve: There was trivial regurgitation.     ASSESSMENT AND PLAN  49 yo male with h/o HTN who presented to [facility] with 2 days onset of chest and epigastric pain. Trop was 8.9 at [facility] as well as Cr of 3.2. Renal function was normal at [facility] after transfer, elevated Cr was lab error (another pt's lab was sent with Mr Tetzloff after review of OSH record.  1. NSTEMI  - cath 06/28/2014 EF 50-55%, occluded posterior lateral Brendalyn Vallely with left to R collaterals, no PCI performed as patient was no longer symptomatic. Medical therapy recommended.   - continue ASA, plavix statin and metoprolol. Continue on current medication, stable  for discharge today.   2. Heart murmur  - Echo 06/28/2014 EF 55-60%, sclerosis of aortiv valve, trial valvular regurg  3. 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 HTN  5. Hyperlipidemia   Signed, Azalee Course PA-C Pager: [phone]   Patient seen and discussed with PA Wynema Birch, I agree with his documentation. Admitted with NSTEMI, cath with occluded PLA but filling with left to right collaterals, managed medically. Post cath Cr 1.15, Hgb 13.9. EKG SR with inferior Q waves, no acute ischemic changes. Medical therapy with ASA, atorva 80, plavix, lopressor. Will start low dose ACE-I 2.5 mg daily. Plan for discharge today, will need f/u in [facility] office in 2 weeks.    Dominga Ferry MD

## 2014-07-02 ENCOUNTER — Telehealth: Payer: Self-pay | Admitting: Cardiology

## 2014-07-02 NOTE — Telephone Encounter (Signed)
New message    Pt C/O medication issue:  1. Name of Medication: On 6 different medication . Patient stating 3 medication could be the cause.    2. How are you currently taking this medication (dosage and times per day)?   3. Are you having a reaction (difficulty breathing--STAT)? diarrhea  Since 5:30 pm on yesterday.   4. What is your medication issue? Side effect.     S/p cath procedure on 4.28.2016 . Release Saturday morning. Patient is stating he has never taken this many medication before.

## 2014-07-02 NOTE — Telephone Encounter (Signed)
I reviewed with Dr Daisy Floroooper--he recommended pt contact his PCP to get evaluation and testing for watery diarrhea/ poss c diff today prior to any adjustments to medications or other recommendations.   Pt states that Dr [place]Vyas [place] and he will contact him this morning for further evaluation for watery diarrhea.  Pt advised to contact our office if it is decided that his medications may be contributing to his diarrhea.   Pt advised to continue to drink fluids to stay hydrated.

## 2014-07-02 NOTE — Telephone Encounter (Signed)
Pt states that he had loose stools while he was in the hospital. [facility] P[facility]. Pt states starting yesterday afternooon he has had watery diarrhea every hour. Pt denies any other symptoms including nausea, vomiting, fever. Pt states all his current medications are new.  Pt states he is able to drink  Gatorade trying to stay hydrated.

## 2014-07-05 ENCOUNTER — Telehealth: Payer: Self-pay | Admitting: Cardiovascular Disease

## 2014-07-05 NOTE — Telephone Encounter (Signed)
Patient calling about bruising noted to cath site from heart cath (4/28). States bruising is a deep purple color. Size at discharge was dime size. Saturday bruising was approx 2" long and wide as a pencil with reddish purple bruising. No swelling or pain or discharge either at discharge or presently. Bruise gradually a little larger yesterday and today, 2" long and wide as thumbnail. Purple is deeper in color. No other coloration. No other problems noted. Encouraged patient to continue to monitor. Please call for any pain, discharge/bleeding, swelling or brighter colored red/purple bruising or if size of bruising increases further. Also advised to use cold compress on it twice daily for the next several days and to limit continuous walking or heavy activity bending leg at that level. Patient verbalized understanding and appreciation for the return phone call.

## 2014-07-05 NOTE — Telephone Encounter (Signed)
New Message  Pt wanted to speak w/ Rn about bruising near surgical wound site (from Cath). Please call back and discuss.

## 2014-08-01 ENCOUNTER — Encounter: Payer: Self-pay | Admitting: Physician Assistant

## 2014-08-01 NOTE — Progress Notes (Addendum)
Cardiology Office Note Date:  08/02/2014  Patient ID:  Walter, Wolf [birth year], MRN [id number] PCP:  Ignatius Specking., MD  Cardiologist:  Excell Seltzer - lives near [place], but this location is more convenient because he works at [employer]   Chief Complaint: follow-up of recent NSTEMI  History of Present Illness: Walter Wolf is a 49 y.o. male with history of CAD/NSTEMI diagnosed 06/2014 managed medically, HTN, dyslipidemia who presents for post-hospital follow-up. He had presented initially to [facility] with 2-day history of chest pain and transferred to [facility] for further evaluation. Troponin rose to over 9. LHC showed EF 50-55%, dominant RCA with small occluded PLA with grade 1 left-to-right collaterals. Given the fact he was asymptomatic prior to cath, this was not treated with stenting. Medical management was recommended. He was placed on aspirin, Plavix, Lipitor, lisinopril and metoprolol. Trig 163, HDL 30, LDL 113, A1C 5.8. 2D Echo showed EF 55-60%, aortic valve sclerosis, trivial MR/TR.  Overall he has done well since discharge. He did have a period of diarrhea several weeks ago and SBP was running 110/60s. He was also reporting fatigue. PCP cut down Lopressor to 12.5mg  daily and changed lisinopril to QHS. Since that time, BP's have been running 130/80 and fatigue has improved. He was not checked for C. diff but other labs were checked and reportedly looked OK. Diarrhea has resolved. He also endorses rare muscle ache - he recalls cardiac rehab telling him this may be a side effect of one of his medicines. He has prior history of joint pains in the winter time. Most recently noticed his joints [place] No CP, SOB, LEE, orthopnea. He initially had some bruising at cath site but this completely resolved. He reports very rare palpitation at night, like skipped beats. No sustained tachypalpitations or continued irregular HR. He is very eager to get back into normal  activity. He generally walks 7-9 miles per day for his job and also enjoys road biking.  Past Medical History  Diagnosis Date  . Hypertension   . Heart murmur     a. aortic sclerosis seen on echo.  . NSTEMI (non-ST elevated myocardial infarction) 06/26/2014  . Dyslipidemia (high LDL; low HDL) 06/2014  . CAD (coronary artery disease)     a. cath 06/28/2014 EF 50-55%, occluded posterior lateral branch with left to R collaterals, no PCI performed as patient was no longer symptomatic. Medically treated.    Past Surgical History  Procedure Laterality Date  . Tonsillectomy  ~ 1973  . Left heart catheterization with coronary angiogram N/A 06/28/2014    Procedure: LEFT HEART CATHETERIZATION WITH CORONARY ANGIOGRAM;  Surgeon: Runell Gess, MD;  Location: [facility] CATH LAB;  Service: Cardiovascular;  Laterality: N/A;    Current Outpatient Prescriptions  Medication Sig Dispense Refill  . aspirin 81 MG chewable tablet Chew 1 tablet (81 mg total) by mouth daily.    Marland Kitchen atorvastatin (LIPITOR) 80 MG tablet Take 1 tablet (80 mg total) by mouth daily at 6 PM. 90 tablet 3  . clopidogrel (PLAVIX) 75 MG tablet Take 1 tablet (75 mg total) by mouth daily. 90 tablet 3  . diphenhydrAMINE (BENADRYL) 25 MG tablet Take 25 mg by mouth every 6 (six) hours as needed for allergies.     Marland Kitchen lisinopril (PRINIVIL,ZESTRIL) 2.5 MG tablet Take 1 tablet (2.5 mg total) by mouth daily. (Patient taking differently: Take 2.5 mg by mouth at bedtime. ) 90 tablet 3  . metoprolol tartrate (  LOPRESSOR) 25 MG tablet Take 0.5 tablets (12.5 mg total) by mouth 2 (two) times daily. (Patient taking differently: Take 12.5 mg by mouth daily. ) 90 tablet 3  . nitroGLYCERIN (NITROSTAT) 0.4 MG SL tablet Place 1 tablet (0.4 mg total) under the tongue every 5 (five) minutes x 3 doses as needed for chest pain. 25 tablet 3   No current facility-administered medications for this visit.    Allergies:   Review of patient's allergies indicates no known  allergies.   Social History:  The patient  reports that he has never smoked. He has never used smokeless tobacco. He reports that he does not drink alcohol or use illicit drugs.   Family History:  The patient's family history includes Diabetes in his mother; Heart disease in his mother; Hypertension in his father and mother; Kidney Stones in his father and mother.  ROS:  Please see the history of present illness. He reports very rare palpitation at night - like a skipped heartbeat -> advised to monitor for further symptoms and let us know if this becomes more frequent at which time we would consider event monitoring. All other systems are reviewed and otherwise negative.   PHYSICAL EXAM:  VS:  BP 128/80 mmHg  Pulse 75  Ht  (1.803 m)  Wt 216 lb 12.8 oz (98.34 kg)  BMI 30.25 kg/m2 BMI: Body mass index is 30.25 kg/(m^2). Well nourished, well developed WM in no acute distress HEENT: normocephalic, atraumatic Neck: no JVD, carotid bruits or masses Cardiac:  normal S1, S2; RRR; no murmurs, rubs, or gallops Lungs:  clear to auscultation bilaterally, no wheezing, rhonchi or rales Abd: soft, nontender, no hepatomegaly, + BS MS: no deformity or atrophy Ext: no edema, right groin site without hematoma, ecchymosis or bruit. Skin: warm and dry, no rash Neuro:  moves all extremities spontaneously, no focal abnormalities noted, follows commands Psych: euthymic mood, full affect  EKG:  Done today shows NSR 75, prior inferior infarct and prior anteiror infarct, poor R wave progression, TWI III, avF. Similar to most recent tracing.  Recent Labs: 06/28/2014: ALT 32; Magnesium 1.9 06/29/2014: BUN 11; Creatinine 1.15; Hemoglobin 13.9; Platelets 207; Potassium 3.9; Sodium 138  06/28/2014: Cholesterol, Total 181; HDL-C 30*; LDL (calc) 118*; Total CHOL/HDL Ratio 6.0; Triglycerides 163*; VLDL 33   CrCl cannot be calculated (Patient has no serum creatinine result on file.).   Wt Readings from Last 3  Encounters:  08/02/14 216 lb 12.8 oz (98.34 kg)  06/29/14 212 lb 15.4 oz (96.6 kg)     Other studies reviewed: Additional studies/records reviewed today include: summarized above  ASSESSMENT AND PLAN:  1. CAD with NSTEMI/PCI as above - doing well. Continue DAPT for at least 1 year. Ultimate duration to be determined by primary cardiologist. See below for comments on statin/BB. Will refer to cardiac rehab - he is interested in seeing what the program has to offer. 2. Essential hypertension - doing well on current regimen with recent changes. He should have a BMET drawn since he was recently started on lisinopril, but he reports that PCP did this when he was going through the diarrhea. He will have their office send [place] those results. Since he is doing well on Lopressor 12.5mg  daily, will change to Toprol XL 12.5mg  daily for more comprehensive coverage. 3. Dyslipidemia - he says his PCP recently checked lipids and they were improving. I have asked him to have his PCP send [place] a copy of most recent lipids and LFTs. If  he has not had f/u LFTs I told him he'll have to have this drawn. We discussed reduction of statin dose. At this time he feels the joint discomfort is very infrequent and not significantly changed from prior episodes. He will let us know if symptoms worsen at which time we may consider reducing Lipitor or changing to another statin. 4. Diarrhea - self-limiting. Resolved. Not clearly related to cardiac medications. 5. Obesity Body mass index is 30.25 kg/(m^2). - he sounds eager to get back into regular physical activity. I have cleared him to return to activity as tolerated. He reports he is eating much healthier. It sounds like he is motivated towards the right track.  Disposition: F/u with Dr. Excell Seltzerooper in 3 months. In the interim I advised him to observe for further palpitations and let us know if they become more frequent at which time we would consider Holter monitoring.  Current  medicines are reviewed at length with the patient today.  The patient did not have any concerns regarding medicines.  Thomasene MohairSigned, Dayna Dunn PA-C 08/02/2014 1:23 PM     [facility] [address] [place] [address] [phone] (office)  [phone] (fax)

## 2014-08-02 ENCOUNTER — Encounter: Payer: Self-pay | Admitting: Physician Assistant

## 2014-08-02 ENCOUNTER — Ambulatory Visit (INDEPENDENT_AMBULATORY_CARE_PROVIDER_SITE_OTHER): Payer: Managed Care, Other (non HMO) | Admitting: Physician Assistant

## 2014-08-02 VITALS — BP 128/80 | HR 75 | Ht 71.0 in | Wt 216.8 lb

## 2014-08-02 DIAGNOSIS — E785 Hyperlipidemia, unspecified: Secondary | ICD-10-CM

## 2014-08-02 DIAGNOSIS — I10 Essential (primary) hypertension: Secondary | ICD-10-CM

## 2014-08-02 DIAGNOSIS — I222 Subsequent non-ST elevation (NSTEMI) myocardial infarction: Secondary | ICD-10-CM | POA: Diagnosis not present

## 2014-08-02 DIAGNOSIS — I2510 Atherosclerotic heart disease of native coronary artery without angina pectoris: Secondary | ICD-10-CM

## 2014-08-02 DIAGNOSIS — I214 Non-ST elevation (NSTEMI) myocardial infarction: Secondary | ICD-10-CM

## 2014-08-02 DIAGNOSIS — Z9861 Coronary angioplasty status: Secondary | ICD-10-CM | POA: Diagnosis not present

## 2014-08-02 DIAGNOSIS — E669 Obesity, unspecified: Secondary | ICD-10-CM

## 2014-08-02 MED ORDER — METOPROLOL SUCCINATE ER 25 MG PO TB24: 12.5000 mg | ORAL_TABLET | Freq: Every day | ORAL | Status: DC

## 2014-08-02 NOTE — Patient Instructions (Addendum)
Medication Instructions:  Your physician has recommended you make the following change in your medication:   1-STOP Lopressor   2-START Toprol 12.5 mg (1/2 tablet) by mouth daily.  Labwork: NONE - please have your PCP send us a copy of your most recent bloodwork. We are looking for a BMET, liver function panel, and the cholesterol results. If you have not had your liver function rechecked since starting the cholesterol medicine, please either let your primary care doctor know or our office so that we can check this.  Testing/Procedures: NONE  Follow-Up: Your physician recommends that you schedule a follow-up appointment in: 3 months with Dr. Excell Seltzerooper.  Any Other Special Instructions Will Be Listed Below (If Applicable)  Referral to Cardiac Rehab

## 2014-11-22 ENCOUNTER — Ambulatory Visit (INDEPENDENT_AMBULATORY_CARE_PROVIDER_SITE_OTHER): Payer: Managed Care, Other (non HMO) | Admitting: Cardiovascular Disease

## 2014-11-22 ENCOUNTER — Encounter: Payer: Self-pay | Admitting: Cardiovascular Disease

## 2014-11-22 VITALS — BP 128/100 | HR 72 | Ht 70.0 in | Wt 215.0 lb

## 2014-11-22 DIAGNOSIS — I2510 Atherosclerotic heart disease of native coronary artery without angina pectoris: Secondary | ICD-10-CM | POA: Diagnosis not present

## 2014-11-22 DIAGNOSIS — I10 Essential (primary) hypertension: Secondary | ICD-10-CM | POA: Diagnosis not present

## 2014-11-22 MED ORDER — METOPROLOL SUCCINATE ER 25 MG PO TB24: 25.0000 mg | ORAL_TABLET | Freq: Every day | ORAL | Status: DC

## 2014-11-22 MED ORDER — LISINOPRIL 5 MG PO TABS: 2.5000 mg | ORAL_TABLET | Freq: Every day | ORAL | Status: DC

## 2014-11-22 NOTE — Progress Notes (Signed)
Cardiology Office Note Date:  11/22/2014   ID:  Othal Kubitz, DOB 30-Jun-1965, MRN [id number]  PCP:  Ignatius Specking., MD  Cardiologist:  Tonny Bollman, MD    Chief Complaint  Patient presents with  . Hypertension    History of Present Illness: Walter Wolf is a 49 y.o. male who presents for follow-up evaluation.  The patient has coronary artery disease and presented with a non-ST elevation infarction in April 2016. He was found to have total occlusion of a small posterior lateral branch and medical therapy was recommended because of the presence of left to right collaterals and no ongoing symptoms. He was placed on dual antiplatelets therapy with aspirin and Plavix, I intensity statin drug, lisinopril, and metoprolol. The patient's LVEF was normal at 55-60%.  His BP has been running high. Diastolic readings at home have been in the range of 95-100 mmHg. Systolic readings have been in the range of 120-128 mmHg.   He has had some dizziness, worse with postural changes. No chest pain, shortness of breath, edema, orthopnea, or PND.  Overall he appears to be doing well except for the problems above with his blood pressure.   Past Medical History  Diagnosis Date  . Hypertension   . Heart murmur     a. aortic sclerosis seen on echo.  . NSTEMI (non-ST elevated myocardial infarction) 06/26/2014  . Dyslipidemia (high LDL; low HDL) 06/2014  . CAD (coronary artery disease)     a. cath 06/28/2014 EF 50-55%, occluded posterior lateral branch with left to R collaterals, no PCI performed as patient was no longer symptomatic. Medically treated.    Past Surgical History  Procedure Laterality Date  . Tonsillectomy  ~ 1973  . Left heart catheterization with coronary angiogram N/A 06/28/2014    Procedure: LEFT HEART CATHETERIZATION WITH CORONARY ANGIOGRAM;  Surgeon: Runell Gess, MD;  Location: [facility] CATH LAB;  Service: Cardiovascular;  Laterality: N/A;    Current Outpatient Prescriptions    Medication Sig Dispense Refill  . aspirin 81 MG chewable tablet Chew 1 tablet (81 mg total) by mouth daily.    Marland Kitchen atorvastatin (LIPITOR) 80 MG tablet Take 1 tablet (80 mg total) by mouth daily at 6 PM. 90 tablet 3  . clopidogrel (PLAVIX) 75 MG tablet Take 1 tablet (75 mg total) by mouth daily. 90 tablet 3  . diphenhydrAMINE (BENADRYL) 25 MG tablet Take 25 mg by mouth every 6 (six) hours as needed for allergies.     Marland Kitchen lisinopril (PRINIVIL,ZESTRIL) 5 MG tablet Take 0.5 tablets (2.5 mg total) by mouth daily. 90 tablet 2  . metoprolol succinate (TOPROL-XL) 25 MG 24 hr tablet Take 1 tablet (25 mg total) by mouth daily. Take with or immediately following a meal. 90 tablet 2  . nitroGLYCERIN (NITROSTAT) 0.4 MG SL tablet Place 1 tablet (0.4 mg total) under the tongue every 5 (five) minutes x 3 doses as needed for chest pain. 25 tablet 3   No current facility-administered medications for this visit.    Allergies:   Review of patient's allergies indicates no known allergies.   Social History:  The patient  reports that he has never smoked. He has never used smokeless tobacco. He reports that he does not drink alcohol or use illicit drugs.   Family History:  The patient's family history includes Diabetes in his mother; Heart disease in his mother; Hypertension in his father and mother; Kidney Stones in his father and mother.    ROS:  Please see the  history of present illness.  Otherwise, review of systems is positive for dizziness, easy bruising.  All other systems are reviewed and negative.    PHYSICAL EXAM: VS:  BP 128/100 mmHg  Pulse 72  Ht  (1.778 m)  Wt 215 lb (97.523 kg)  BMI 30.85 kg/m2 , BMI Body mass index is 30.85 kg/(m^2). GEN: Well nourished, well developed, in no acute distress HEENT: normal Neck: no JVD, no masses. No carotid bruits Cardiac: RRR without murmur or gallop                Respiratory:  clear to auscultation bilaterally, normal work of breathing GI: soft,  nontender, nondistended, + BS MS: no deformity or atrophy Ext: no pretibial edema, pedal pulses 2+= bilaterally Skin: warm and dry, no rash Neuro:  Strength and sensation are intact Psych: euthymic mood, full affect  EKG:  EKG is not ordered today.  Recent Labs: 06/28/2014: ALT 32; Magnesium 1.9 06/29/2014: BUN 11; Creatinine, Ser 1.15; Hemoglobin 13.9; Platelets 207; Potassium 3.9; Sodium 138   Lipid Panel     Component Value Date/Time   CHOL 181 06/28/2014 0336   TRIG 163* 06/28/2014 0336   HDL 30* 06/28/2014 0336   CHOLHDL 6.0 06/28/2014 0336   VLDL 33 06/28/2014 0336   LDLCALC 118* 06/28/2014 0336      Wt Readings from Last 3 Encounters:  11/22/14 215 lb (97.523 kg)  08/02/14 216 lb 12.8 oz (98.34 kg)  06/29/14 212 lb 15.4 oz (96.6 kg)     Cardiac Studies Reviewed: 2D Echo 06/28/2014: Study Conclusions  - Left ventricle: The cavity size was normal. Wall thickness was normal. Systolic function was normal. The estimated ejection fraction was in the range of 55% to 60%. - Aortic valve: Moderate thickening and calcification, consistent with sclerosis. - Mitral valve: There was trivial regurgitation. - Tricuspid valve: There was trivial regurgitation.  ASSESSMENT AND PLAN: 1.   CAD, native vessel : The patient is stable without symptoms of angina. He will continue on his current medication with changes as outlined below to better treat his blood pressure. I will see him back in 6 months and consider stopping Plavix at that time.  2. Essential hypertension, uncontrolled : Recommend that he increase Toprol-XL and lisinopril. He will monitor blood pressure and will send in his readings in a few weeks via MyChart.  3. Hyperlipidemia:  The patient is on a high intensity statin drug. He is followed by his primary physician.   Current medicines are reviewed with the patient today.  The patient does not have concerns regarding medicines.  Labs/ tests ordered today  include:  No orders of the defined types were placed in this encounter.    Disposition:   FU 6 months  Signed, Tonny Bollman, MD  11/22/2014 5:39 PM    [facility] [address] [place], [place], [place]  [address] Phone: [phone]; Fax: [phone]

## 2014-11-22 NOTE — Patient Instructions (Signed)
Medication Instructions:   START TAKING LISINOPRIL 5 MG ONCE A DAY   START TAKING METOPROLOL 25 MG ONCE A DAY    Labwork: NONE ORDER TODAY    Testing/Procedures: NONE ORDER TODAY'   Follow-Up:  Your physician wants you to follow-up in:  IN 6  MONTHS WITH DR Excell Seltzer  You will receive a reminder letter in the mail two months in advance. If you don't receive a letter, please call our office to schedule the follow-up appointment.     Any Other Special Instructions Will Be Listed Below (If Applicable).

## 2015-08-09 ENCOUNTER — Ambulatory Visit (INDEPENDENT_AMBULATORY_CARE_PROVIDER_SITE_OTHER): Payer: Managed Care, Other (non HMO) | Admitting: Cardiovascular Disease

## 2015-08-09 ENCOUNTER — Encounter: Payer: Self-pay | Admitting: Cardiovascular Disease

## 2015-08-09 VITALS — BP 124/80 | HR 57 | Ht 70.0 in | Wt 219.0 lb

## 2015-08-09 DIAGNOSIS — I2510 Atherosclerotic heart disease of native coronary artery without angina pectoris: Secondary | ICD-10-CM | POA: Diagnosis not present

## 2015-08-09 DIAGNOSIS — E785 Hyperlipidemia, unspecified: Secondary | ICD-10-CM

## 2015-08-09 DIAGNOSIS — I10 Essential (primary) hypertension: Secondary | ICD-10-CM | POA: Diagnosis not present

## 2015-08-09 MED ORDER — LISINOPRIL 5 MG PO TABS: 2.5000 mg | ORAL_TABLET | Freq: Every day | ORAL | Status: DC

## 2015-08-09 MED ORDER — METOPROLOL SUCCINATE ER 25 MG PO TB24: 25.0000 mg | ORAL_TABLET | Freq: Every day | ORAL | Status: DC

## 2015-08-09 MED ORDER — NITROGLYCERIN 0.4 MG SL SUBL: 0.4000 mg | SUBLINGUAL_TABLET | SUBLINGUAL | Status: AC | PRN

## 2015-08-09 MED ORDER — ATORVASTATIN CALCIUM 80 MG PO TABS: 80.0000 mg | ORAL_TABLET | Freq: Every day | ORAL | Status: AC

## 2015-08-09 NOTE — Progress Notes (Signed)
Cardiology Office Note Date:  08/11/2015   ID:  Walter PetrinYancey Dimattia, DOB 02-27-1966, MRN [id number]  PCP:  Ignatius Speckinghruv B Vyas, MD  Cardiologist:  Tonny Bollmanooper, Gerber Penza, MD    Chief Complaint  Patient presents with  . Follow-up    CAD     History of Present Illness: Walter Wolf is a 50 y.o. male who presents for follow-up evaluation. The patient has coronary artery disease and presented with a non-ST elevation infarction in April 2016. He was found to have total occlusion of a small posterior lateral branch and medical therapy was recommended because of the presence of left to right collaterals and no ongoing symptoms. He was placed on dual antiplatelets therapy with aspirin and Plavix, statin drug, lisinopril, and metoprolol. The patient's LVEF was normal at 55-60%. When he was last seen here in September 2016 his blood pressure was running high and his doses of metoprolol succinate and lisinopril were increased.  The patient is doing well. States his blood pressure has been very well controlled. 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 He's on his feet all day on concrete and has some knee problems. He is riding a bike for exercise. Today, he denies symptoms of palpitations, chest pain, shortness of breath, orthopnea, PND, lower extremity edema, dizziness, or syncope.  Past Medical History  Diagnosis Date  . Hypertension   . Heart murmur     a. aortic sclerosis seen on echo.  . NSTEMI (non-ST elevated myocardial infarction) (HCC) 06/26/2014  . Dyslipidemia (high LDL; low HDL) 06/2014  . CAD (coronary artery disease)     a. cath 06/28/2014 EF 50-55%, occluded posterior lateral branch with left to R collaterals, no PCI performed as patient was no longer symptomatic. Medically treated.    Past Surgical History  Procedure Laterality Date  . Tonsillectomy  ~ 1973  . Left heart catheterization with coronary angiogram N/A 06/28/2014    Procedure: LEFT HEART CATHETERIZATION WITH  CORONARY ANGIOGRAM;  Surgeon: Runell GessJonathan J Berry, MD;  [facility];  Service: Cardiovascular;  Laterality: N/A;    Current Outpatient Prescriptions  Medication Sig Dispense Refill  . aspirin 81 MG chewable tablet Chew 1 tablet (81 mg total) by mouth daily.    Marland Kitchen. atorvastatin (LIPITOR) 80 MG tablet Take 1 tablet (80 mg total) by mouth daily at 6 PM. 90 tablet 3  . diphenhydrAMINE (BENADRYL) 25 MG tablet Take 25 mg by mouth every 6 (six) hours as needed for allergies.     Marland Kitchen. lisinopril (PRINIVIL,ZESTRIL) 5 MG tablet Take 0.5 tablets (2.5 mg total) by mouth daily. 90 tablet 3  . metoprolol succinate (TOPROL-XL) 25 MG 24 hr tablet Take 1 tablet (25 mg total) by mouth daily. Take with or immediately following a meal. 90 tablet 3  . nitroGLYCERIN (NITROSTAT) 0.4 MG SL tablet Place 1 tablet (0.4 mg total) under the tongue every 5 (five) minutes x 3 doses as needed for chest pain. 25 tablet 3   No current facility-administered medications for this visit.    Allergies:   Review of patient's allergies indicates no known allergies.   Social History:  The patient  reports that he has never smoked. He has never used smokeless tobacco. He reports that he does not drink alcohol or use illicit drugs.   Family History:  The patient's family history includes Diabetes in his mother; Heart disease in his mother; Hypertension in his father and mother; Kidney Stones in his father and  mother.    ROS:  Please see the history of present illness.   All other systems are reviewed and negative.    PHYSICAL EXAM: VS:  BP 124/80 mmHg  Pulse 57  Ht  (1.778 m)  Wt 219 lb (99.338 kg)  BMI 31.42 kg/m2 , BMI Body mass index is 31.42 kg/(m^2). GEN: Well nourished, well developed, in no acute distress HEENT: normal Neck: no JVD, no masses. No carotid bruits Cardiac: RRR without murmur or gallop                Respiratory:  clear to auscultation bilaterally, normal work of breathing GI: soft, nontender,  nondistended, + BS MS: no deformity or atrophy Ext: no pretibial edema, pedal pulses 2+= bilaterally Skin: warm and dry, no rash Neuro:  Strength and sensation are intact Psych: euthymic mood, full affect  EKG:  EKG is ordered today. The ekg ordered today shows sinus bradycardia 57 bpm, possible age-indeterminate inferior MI, otherwise within normal limits.  Recent Labs: No results found for requested labs within last 365 days.   Lipid Panel     Component Value Date/Time   CHOL 181 06/28/2014 0336   TRIG 163* 06/28/2014 0336   HDL 30* 06/28/2014 0336   CHOLHDL 6.0 06/28/2014 0336   VLDL 33 06/28/2014 0336   LDLCALC 118* 06/28/2014 0336      Wt Readings from Last 3 Encounters:  08/09/15 219 lb (99.338 kg)  11/22/14 215 lb (97.523 kg)  08/02/14 216 lb 12.8 oz (98.34 kg)     Cardiac Studies Reviewed: 2D Echo 06-28-2014: Study Conclusions  - Left ventricle: The cavity size was normal. Wall thickness was  normal. Systolic function was normal. The estimated ejection  fraction was in the range of 55% to 60%. - Aortic valve: Moderate thickening and calcification, consistent  with sclerosis. - Mitral valve: There was trivial regurgitation. - Tricuspid valve: There was trivial regurgitation.  Cardiac Cath 06-28-2014: HEMODYNAMICS:   AO SYSTOLIC/AO DIASTOLIC: 142/84  LV SYSTOLIC/LV DIASTOLIC: 146/15  ANGIOGRAPHIC RESULTS:   1. Left main; normal  2. LAD; normal 3. Left circumflex; nondominant and normal. There was a large posterior lateral branch. 4. Right coronary artery; dominant with a small PDA and occluded PLA beyond this. The PLA filled by grade 1 left-to-right collaterals. 5. Left ventriculography; RAO left ventriculogram was performed using  25 mL of Visipaque dye at 12 mL/second. The overall LVEF estimated  50-55 % With wall motion abnormalities notable for subtle inferior hypokinesia  IMPRESSION:Mr. Redner has an occluded posterior lateral branch,  grade 1 left right collaterals with preserved LV function. He is asymptomatic. At this point, there is no indication to perform percutaneous revascularization. Medical therapy will be recommended. A femoral angiogram was performed and a MYNX closure device was used to obtain hemostasis. The patient left the lab in stable condition  ASSESSMENT AND PLAN: 1.  CAD, native vessel: Stable with no symptoms of angina. He is greater than 12 months out from his MI. He was instructed to discontinue clopidogrel but continue aspirin 81 mg lifelong. No other medication changes are made today.  2. Essential hypertension: Blood pressure now well controlled on a combination of lisinopril and metoprolol succinate.  3. Hyperlipidemia: The patient is treated with a statin drug. His primary care physician follows his lipids.  Current medicines are reviewed with the patient today.  The patient does not have concerns regarding medicines.  Labs/ tests ordered today include:   Orders Placed This Encounter  Procedures  .  EKG 12-Lead    Disposition:   FU one year  Signed, Tonny Bollman, MD  08/11/2015 9:47 PM    [facility] [address] [place], [place], [place]  [address] Phone: [phone]; Fax: [phone]

## 2015-08-09 NOTE — Patient Instructions (Signed)
Medication Instructions:  Your physician has recommended you make the following change in your medication:  1. STOP Plavix  Labwork: No new orders.   Testing/Procedures: No new orders.   Follow-Up: Your physician wants you to follow-up in: 1 YEAR with Dr Cooper.  You will receive a reminder letter in the mail two months in advance. If you don't receive a letter, please call our office to schedule the follow-up appointment.   Any Other Special Instructions Will Be Listed Below (If Applicable).     If you need a refill on your cardiac medications before your next appointment, please call your pharmacy.   

## 2015-08-19 ENCOUNTER — Other Ambulatory Visit: Payer: Self-pay | Admitting: Cardiovascular Disease

## 2016-08-14 ENCOUNTER — Other Ambulatory Visit: Payer: Self-pay | Admitting: Cardiovascular Disease

## 2016-08-14 DIAGNOSIS — I2510 Atherosclerotic heart disease of native coronary artery without angina pectoris: Secondary | ICD-10-CM

## 2016-08-14 DIAGNOSIS — E785 Hyperlipidemia, unspecified: Secondary | ICD-10-CM

## 2016-08-14 DIAGNOSIS — I10 Essential (primary) hypertension: Secondary | ICD-10-CM

## 2016-08-28 ENCOUNTER — Other Ambulatory Visit: Payer: Self-pay | Admitting: Cardiovascular Disease

## 2016-08-28 DIAGNOSIS — I10 Essential (primary) hypertension: Secondary | ICD-10-CM

## 2016-08-28 DIAGNOSIS — E785 Hyperlipidemia, unspecified: Secondary | ICD-10-CM

## 2016-08-28 DIAGNOSIS — I2510 Atherosclerotic heart disease of native coronary artery without angina pectoris: Secondary | ICD-10-CM

## 2016-09-01 ENCOUNTER — Other Ambulatory Visit: Payer: Self-pay | Admitting: Cardiovascular Disease

## 2016-09-01 DIAGNOSIS — I10 Essential (primary) hypertension: Secondary | ICD-10-CM

## 2016-09-01 DIAGNOSIS — E785 Hyperlipidemia, unspecified: Secondary | ICD-10-CM

## 2016-09-01 DIAGNOSIS — I2510 Atherosclerotic heart disease of native coronary artery without angina pectoris: Secondary | ICD-10-CM

## 2016-09-08 ENCOUNTER — Telehealth: Payer: Self-pay | Admitting: Cardiovascular Disease

## 2016-09-08 DIAGNOSIS — I10 Essential (primary) hypertension: Secondary | ICD-10-CM

## 2016-09-08 DIAGNOSIS — I2510 Atherosclerotic heart disease of native coronary artery without angina pectoris: Secondary | ICD-10-CM

## 2016-09-08 MED ORDER — METOPROLOL SUCCINATE ER 25 MG PO TB24: 25.0000 mg | ORAL_TABLET | Freq: Every day | ORAL | 0 refills | Status: DC

## 2016-09-08 NOTE — Telephone Encounter (Signed)
New message       *STAT* If patient is at the pharmacy, call can be transferred to refill team.   1. Which medications need to be refilled? (please list name of each medication and dose if known) metoprolol 25mg   2. Which pharmacy/location (including street and city if local pharmacy) is medication to be sent to? [facility] [address] [facility] [place], [place] 3. Do they need a 30 day or 90 day supply? 90 day

## 2016-09-08 NOTE — Telephone Encounter (Signed)
Pt's medication was sent to pt's pharmacy as requested. Confirmation received.  °

## 2016-10-06 ENCOUNTER — Other Ambulatory Visit: Payer: Self-pay | Admitting: Cardiovascular Disease

## 2016-10-06 DIAGNOSIS — E785 Hyperlipidemia, unspecified: Secondary | ICD-10-CM

## 2016-10-06 DIAGNOSIS — I2510 Atherosclerotic heart disease of native coronary artery without angina pectoris: Secondary | ICD-10-CM

## 2016-10-06 DIAGNOSIS — I10 Essential (primary) hypertension: Secondary | ICD-10-CM

## 2016-10-09 ENCOUNTER — Other Ambulatory Visit: Payer: Self-pay | Admitting: Cardiovascular Disease

## 2016-10-09 DIAGNOSIS — I10 Essential (primary) hypertension: Secondary | ICD-10-CM

## 2016-10-09 DIAGNOSIS — E785 Hyperlipidemia, unspecified: Secondary | ICD-10-CM

## 2016-10-09 DIAGNOSIS — I2510 Atherosclerotic heart disease of native coronary artery without angina pectoris: Secondary | ICD-10-CM

## 2016-12-24 ENCOUNTER — Ambulatory Visit (INDEPENDENT_AMBULATORY_CARE_PROVIDER_SITE_OTHER): Payer: Self-pay | Admitting: Cardiovascular Disease

## 2016-12-24 ENCOUNTER — Encounter: Payer: Self-pay | Admitting: Cardiovascular Disease

## 2016-12-24 ENCOUNTER — Telehealth: Payer: Self-pay

## 2016-12-24 VITALS — BP 138/96 | HR 81 | Ht 70.0 in | Wt 223.6 lb

## 2016-12-24 DIAGNOSIS — E785 Hyperlipidemia, unspecified: Secondary | ICD-10-CM

## 2016-12-24 DIAGNOSIS — I10 Essential (primary) hypertension: Secondary | ICD-10-CM

## 2016-12-24 DIAGNOSIS — I2510 Atherosclerotic heart disease of native coronary artery without angina pectoris: Secondary | ICD-10-CM

## 2016-12-24 NOTE — Progress Notes (Signed)
Cardiology Office Note Date:  12/24/2016   ID:  Walter PetrinYancey Buss, DOB 10/13/1965, MRN [id number]  PCP:  Ignatius SpeckingVyas, Dhruv B, MD  Cardiologist:  Tonny Bollmanooper, Kayleann Mccaffery, MD    Chief Complaint  Patient presents with  . Follow-up     History of Present Illness: Walter Wolf is a 51 y.o. male who presents for follow-up of CAD.  He initially presented in 2016 with a non-STEMI.  He was found to have total occlusion of a small posterolateral branch and medical therapy was recommended.  He is had no recurrent anginal symptoms.  He remains active with no exertional angina or shortness of breath.  His LV function has been normal with an echo demonstrating an LVEF of 55-60%.  He is here alone today. He is staying busy with his work at [employer]. Exercising regularly without symptoms. Today, he denies symptoms of palpitations, chest pain, shortness of breath, orthopnea, PND, lower extremity edema, dizziness, or syncope. BP was in good range at his recent PCP office visit.   Past Medical History:  Diagnosis Date  . CAD (coronary artery disease)    a. cath 06/28/2014 EF 50-55%, occluded posterior lateral branch with left to R collaterals, no PCI performed as patient was no longer symptomatic. Medically treated.  Marland Kitchen. Dyslipidemia (high LDL; low HDL) 06/2014  . Heart murmur    a. aortic sclerosis seen on echo.  . Hypertension   . NSTEMI (non-ST elevated myocardial infarction) (HCC) 06/26/2014    Past Surgical History:  Procedure Laterality Date  . LEFT HEART CATHETERIZATION WITH CORONARY ANGIOGRAM N/A 06/28/2014   Procedure: LEFT HEART CATHETERIZATION WITH CORONARY ANGIOGRAM;  Surgeon: Runell GessJonathan J Berry, MD;  [facility];  Service: Cardiovascular;  Laterality: N/A;  . TONSILLECTOMY  ~ 1973    Current Outpatient Prescriptions  Medication Sig Dispense Refill  . aspirin 81 MG chewable tablet Chew 1 tablet (81 mg total) by mouth daily.    Marland Kitchen. atorvastatin (LIPITOR) 80 MG tablet Take 1 tablet (80 mg total) by  mouth daily at 6 PM. 90 tablet 3  . diphenhydrAMINE (BENADRYL) 25 MG tablet Take 25 mg by mouth every 6 (six) hours as needed for allergies.     Marland Kitchen. lisinopril (PRINIVIL,ZESTRIL) 5 MG tablet TAKE 0.5 TABLETS (2.5 MG TOTAL) BY MOUTH DAILY. 45 tablet 0  . metoprolol succinate (TOPROL-XL) 25 MG 24 hr tablet TAKE 1 TABLET (25 MG TOTAL) BY MOUTH DAILY. TAKE WITH OR IMMEDIATELY FOLLOWING A MEAL. 30 tablet 0  . nitroGLYCERIN (NITROSTAT) 0.4 MG SL tablet Place 1 tablet (0.4 mg total) under the tongue every 5 (five) minutes x 3 doses as needed for chest pain. 25 tablet 3   No current facility-administered medications for this visit.     Allergies:   Patient has no known allergies.   Social History:  The patient  reports that he has never smoked. He has never used smokeless tobacco. He reports that he does not drink alcohol or use drugs.   Family History:  The patient's family history includes Diabetes in his mother; Heart disease in his mother; Hypertension in his father and mother; Kidney Stones in his father and mother.    ROS:  Please see the history of present illness.  Otherwise, review of systems is positive for knee pain.  All other systems are reviewed and negative.    PHYSICAL EXAM: VS:  BP (!) 138/96   Pulse 81   Ht 5\' 10"  (1.778 m)   Wt 223 lb 9.6 oz (  101.4 kg)   BMI 32.08 kg/m  , BMI Body mass index is 32.08 kg/m. GEN: Well nourished, well developed, in no acute distress  HEENT: normal  Neck: no JVD, no masses. No carotid bruits Cardiac: RRR without murmur or gallop                Respiratory:  clear to auscultation bilaterally, normal work of breathing GI: soft, nontender, nondistended, + BS MS: no deformity or atrophy  Ext: no pretibial edema, pedal pulses 2+= bilaterally Skin: warm and dry, no rash Neuro:  Strength and sensation are intact Psych: euthymic mood, full affect  EKG:  EKG is ordered today. The ekg ordered today shows NSR 81 bpm, LAFB, age indeterminate inferior  infarct  Recent Labs: No results found for requested labs within last 8760 hours.   Lipid Panel     Component Value Date/Time   CHOL 181 06/28/2014 0336   TRIG 163 (H) 06/28/2014 0336   HDL 30 (L) 06/28/2014 0336   CHOLHDL 6.0 06/28/2014 0336   VLDL 33 06/28/2014 0336   LDLCALC 118 (H) 06/28/2014 0336      Wt Readings from Last 3 Encounters:  12/24/16 223 lb 9.6 oz (101.4 kg)  08/09/15 219 lb (99.3 kg)  11/22/14 215 lb (97.5 kg)     Cardiac Studies Reviewed: 2D Echo 06-28-2014: Study Conclusions  - Left ventricle: The cavity size was normal. Wall thickness was   normal. Systolic function was normal. The estimated ejection   fraction was in the range of 55% to 60%. - Aortic valve: Moderate thickening and calcification, consistent   with sclerosis. - Mitral valve: There was trivial regurgitation. - Tricuspid valve: There was trivial regurgitation.  ASSESSMENT AND PLAN: 1.  Coronary artery disease, native vessel, without angina: The patient is stable from a cardiac perspective.  His medicines were reviewed and include aspirin 81 mg daily, high intensity statin drug with atorvastatin 80 mg daily, an ACE inhibitor, and a beta-blocker.  Will continue.  2.  Hypertension: Diastolic blood pressure is elevated today.  He continues on lisinopril and metoprolol succinate.  He was just at his primary care physician's office yesterday with normal blood pressure.  Will follow.  3.  Hyperlipidemia: Treated with atorvastatin 80 mg daily.  Lipids are followed by his PCP.  Overall Mr. Senteno appears to be stable.  I will see him back next year.  Current medicines are reviewed with the patient today.  The patient does not have concerns regarding medicines.  Labs/ tests ordered today include:  No orders of the defined types were placed in this encounter.   Disposition:   FU one year  Signed, Tonny Bollman, MD  12/24/2016 4:03 PM    [facility] [address] [place], [place], [place]  [address] Phone: [phone]; Fax: [phone]

## 2016-12-24 NOTE — Patient Instructions (Signed)

## 2016-12-24 NOTE — Telephone Encounter (Signed)
Called patient to tell him he left his reading glasses at his OV today and offered to place them at the front desk for pick up. Patient declines offer - he states he is already on the highway and "the glasses were only 3 dollars anyway." He requests the glasses be thrown away. He was grateful for call and offer to hold glasses.  Placed glasses in sharps container.

## 2017-03-27 ENCOUNTER — Other Ambulatory Visit: Payer: Self-pay | Admitting: Physician Assistant

## 2017-03-27 DIAGNOSIS — E785 Hyperlipidemia, unspecified: Secondary | ICD-10-CM

## 2017-03-27 DIAGNOSIS — I10 Essential (primary) hypertension: Secondary | ICD-10-CM

## 2017-03-27 DIAGNOSIS — I2510 Atherosclerotic heart disease of native coronary artery without angina pectoris: Secondary | ICD-10-CM

## 2017-04-28 ENCOUNTER — Other Ambulatory Visit: Payer: Self-pay | Admitting: Emergency Medicine

## 2017-04-28 DIAGNOSIS — M25512 Pain in left shoulder: Secondary | ICD-10-CM

## 2017-05-17 ENCOUNTER — Inpatient Hospital Stay
Admission: RE | Admit: 2017-05-17 | Discharge: 2017-05-17 | Disposition: A | Discharge status: Home / Self Care | Payer: Self-pay | Source: Ambulatory Visit | Attending: Emergency Medicine | Admitting: Emergency Medicine

## 2017-05-20 ENCOUNTER — Ambulatory Visit
Admission: RE | Admit: 2017-05-20 | Discharge: 2017-05-20 | Disposition: A | Discharge status: Home / Self Care | Payer: Commercial Managed Care - PPO | Source: Ambulatory Visit | Attending: Emergency Medicine | Admitting: Emergency Medicine

## 2017-05-20 ENCOUNTER — Other Ambulatory Visit: Payer: Self-pay | Admitting: Emergency Medicine

## 2017-05-20 DIAGNOSIS — Z77018 Contact with and (suspected) exposure to other hazardous metals: Secondary | ICD-10-CM

## 2017-05-20 DIAGNOSIS — M25512 Pain in left shoulder: Secondary | ICD-10-CM

## 2017-05-21 ENCOUNTER — Other Ambulatory Visit: Payer: Self-pay | Admitting: *Deleted

## 2017-05-21 DIAGNOSIS — E785 Hyperlipidemia, unspecified: Secondary | ICD-10-CM

## 2017-05-21 DIAGNOSIS — I2510 Atherosclerotic heart disease of native coronary artery without angina pectoris: Secondary | ICD-10-CM

## 2017-05-21 DIAGNOSIS — I10 Essential (primary) hypertension: Secondary | ICD-10-CM

## 2017-05-21 MED ORDER — LISINOPRIL 5 MG PO TABS
2.5000 mg | ORAL_TABLET | Freq: Every day | ORAL | 1 refills | Status: AC
Start: 2017-05-21 — End: ?

## 2017-06-07 ENCOUNTER — Encounter (INDEPENDENT_AMBULATORY_CARE_PROVIDER_SITE_OTHER): Payer: Self-pay | Admitting: Orthopedic Surgery

## 2017-06-07 ENCOUNTER — Ambulatory Visit (INDEPENDENT_AMBULATORY_CARE_PROVIDER_SITE_OTHER): Payer: Commercial Managed Care - PPO | Admitting: Orthopedic Surgery

## 2017-06-07 DIAGNOSIS — M19019 Primary osteoarthritis, unspecified shoulder: Secondary | ICD-10-CM

## 2017-06-07 MED ORDER — MELOXICAM 7.5 MG PO TABS: ORAL_TABLET | ORAL | 0 refills | Status: DC

## 2017-06-10 ENCOUNTER — Encounter (INDEPENDENT_AMBULATORY_CARE_PROVIDER_SITE_OTHER): Payer: Self-pay | Admitting: Orthopedic Surgery

## 2017-06-10 NOTE — Progress Notes (Signed)
Office Visit Note   Patient: Walter Wolf           Date of Birth: [birth year]           MRN: [id number] Visit Date: 06/07/2017 Requested by: Ignatius SpeckingVyas, Dhruv B, MD [address] [place], [place] [id number] PCP: Ignatius SpeckingVyas, Dhruv B, MD  Subjective: Chief Complaint  Patient presents with  . Left Shoulder - Pain    HPI: ENT is a patient with left shoulder pain.  Been going on for about 6 months.  He is right-hand dominant.  The pain will occasionally wake him from sleep at night.  Describes catching and grabbing in the shoulder.  MRI scan on the 10 system is reviewed with the patient.  He has pretty severe end-stage glenohumeral arthritis.  No definite rotator cuff pathology.  He is getting some posterior wear in the glenoid which is giving him labral tearing and cyst formation posteriorly.  Does not take any medication for pain.  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  He is right shoulder is fine.  He was never a weight lifter.  It hard for him to put pressure on his shoulder above shoulder level.  He otherwise is pretty active.              ROS: All systems reviewed are negative as they relate to the chief complaint within the history of present illness.  Patient denies  fevers or chills.   Assessment & Plan: Visit Diagnoses:  1. Shoulder arthritis     Plan: Impression is left shoulder arthritis in an active patient.  He is on the young side as well.  Plan is nonsteroidal for 30 days with a course of physical therapy.  8-week return to see how he does.  Mobic 7.5 mg called in.  I will see him back and we can decide then about further intervention if needed.  I would likely consist of a cortisone injection to see if we can get some sustained relief.  Follow-Up Instructions: Return in about 8 weeks (around 08/02/2017).   Orders:  No orders of the defined types were placed in this encounter.  Meds ordered this encounter  Medications  . meloxicam (MOBIC) 7.5 MG tablet    Sig: 1 po  q d prn    Dispense:  30 tablet    Refill:  0      Procedures: No procedures performed   Clinical Data: No additional findings.  Objective: Vital Signs: There were no vitals taken for this visit.  Physical Exam:   Constitutional: Patient appears well-developed HEENT:  Head: Normocephalic Eyes:EOM are normal Neck: Normal range of motion Cardiovascular: Normal rate Pulmonary/chest: Effort normal Neurologic: Patient is alert Skin: Skin is warm Psychiatric: Patient has normal mood and affect    Ortho Exam: Orthopedic exam demonstrates decreased external rotation on the left compared to the right.  On the right he is around 60 degrees on the left about 30 or less.  He has good rotator cuff strength isolated infraspinatus supination and subscap muscle testing.  About 65 degrees of abduction on the on the left with some scapular involvement.  Forward flexion is slightly past 90 on the left.  Does have some coarseness and grinding with passive range of motion of that left shoulder.  Motor sensory function to the hand is intact.  Deltoid fires.  Specialty Comments:  No specialty comments available.  Imaging: No results found.   PMFS History: Patient Active  Problem List   Diagnosis Date Noted  . Heart murmur   . Hypertension   . NSTEMI (non-ST elevated myocardial infarction) (HCC) 06/27/2014  . Dyslipidemia (high LDL; low HDL) 06/01/2014   Past Medical History:  Diagnosis Date  . CAD (coronary artery disease)    a. cath 06/28/2014 EF 50-55%, occluded posterior lateral branch with left to R collaterals, no PCI performed as patient was no longer symptomatic. Medically treated.  Marland Kitchen Dyslipidemia (high LDL; low HDL) 06/2014  . Heart murmur    a. aortic sclerosis seen on echo.  . Hypertension   . NSTEMI (non-ST elevated myocardial infarction) (HCC) 06/26/2014    Family History  Problem Relation Age of Onset  . Heart disease Mother   . Diabetes Mother   . Hypertension Mother    . Kidney Stones Mother   . Hypertension Father   . Kidney Stones Father     Past Surgical History:  Procedure Laterality Date  . LEFT HEART CATHETERIZATION WITH CORONARY ANGIOGRAM N/A 06/28/2014   Procedure: LEFT HEART CATHETERIZATION WITH CORONARY ANGIOGRAM;  Surgeon: Runell Gess, MD;  Location: [facility] CATH LAB;  Service: Cardiovascular;  Laterality: N/A;  . TONSILLECTOMY  ~ 1973   Social History   Occupational History  . Occupation: Emergency planning/management officer at [employer]  . Smoking status: Never Smoker  . Smokeless tobacco: Never Used  Substance and Sexual Activity  . Alcohol use: No  . Drug use: No  . Sexual activity: Yes

## 2017-06-11 NOTE — Addendum Note (Signed)
Addended byPrescott Parma: Ahmad Vanwey on: 06/11/2017 11:33 AM   Modules accepted: Orders

## 2017-06-14 ENCOUNTER — Other Ambulatory Visit (INDEPENDENT_AMBULATORY_CARE_PROVIDER_SITE_OTHER): Payer: Self-pay

## 2017-06-14 ENCOUNTER — Telehealth (INDEPENDENT_AMBULATORY_CARE_PROVIDER_SITE_OTHER): Payer: Self-pay | Admitting: Orthopedic Surgery

## 2017-06-14 MED ORDER — MELOXICAM 7.5 MG PO TABS: ORAL_TABLET | ORAL | 0 refills | Status: DC

## 2017-06-14 NOTE — Telephone Encounter (Signed)
Patient said pharmacy never received rx request of meloxicam sent on the 8th. Can you try to resend it to [facility] [address] [place]

## 2017-06-14 NOTE — Telephone Encounter (Signed)
rx resubmitted to pharmacy.

## 2017-07-14 ENCOUNTER — Other Ambulatory Visit (INDEPENDENT_AMBULATORY_CARE_PROVIDER_SITE_OTHER): Payer: Self-pay | Admitting: Orthopedic Surgery

## 2017-07-14 NOTE — Telephone Encounter (Signed)
y

## 2017-07-14 NOTE — Telephone Encounter (Signed)
Ok to rf? 

## 2017-08-09 ENCOUNTER — Encounter (INDEPENDENT_AMBULATORY_CARE_PROVIDER_SITE_OTHER): Payer: Self-pay | Admitting: Orthopedic Surgery

## 2017-08-09 ENCOUNTER — Ambulatory Visit (INDEPENDENT_AMBULATORY_CARE_PROVIDER_SITE_OTHER): Payer: Commercial Managed Care - PPO | Admitting: Orthopedic Surgery

## 2017-08-09 DIAGNOSIS — M19019 Primary osteoarthritis, unspecified shoulder: Secondary | ICD-10-CM

## 2017-08-09 MED ORDER — MELOXICAM 7.5 MG PO TABS: ORAL_TABLET | ORAL | 0 refills | Status: AC

## 2017-08-09 NOTE — Progress Notes (Signed)
Office Visit Note   Patient: Walter Wolf           Date of Birth: 1965/09/01           MRN: [id number] Visit Date: 08/09/2017 Requested by: Ignatius Specking, MD [address] [place], [place] [address] PCP: Ignatius Specking, MD  Subjective: Chief Complaint  Patient presents with  . Left Shoulder - Follow-up    HPI: Walter Wolf is a patient with left shoulder arthritis.  Here for 57-month follow-up.  Took Mobic 7.5 mg for several weeks.  He has not had any in 2 weeks.  He has been in physical therapy and has 7 more visits.  He is doing well.  Pain is less and motion is more.  He is functional.              ROS: All systems reviewed are negative as they relate to the chief complaint within the history of present illness.  Patient denies  fevers or chills.   Assessment & Plan: Visit Diagnoses:  1. Shoulder arthritis     Plan: Impression is improvement in left shoulder arthritis with physical therapy and anti-inflammatories.  Walter Wolf refilled the Mobic to be taken on an as-needed basis.  Follow-up with me as needed.  Continue to finish physical therapy and work on home exercise program.  Follow-Up Instructions: Return if symptoms worsen or fail to improve.   Orders:  No orders of the defined types were placed in this encounter.  Meds ordered this encounter  Medications  . meloxicam (MOBIC) 7.5 MG tablet    Sig: TAKE ONE TABLET BY MOUTH DAILY AS NEEDED    Dispense:  30 tablet    Refill:  0      Procedures: No procedures performed   Clinical Data: No additional findings.  Objective: Vital Signs: There were no vitals taken for this visit.  Physical Exam:   Constitutional: Patient appears well-developed HEENT:  Head: Normocephalic Eyes:EOM are normal Neck: Normal range of motion Cardiovascular: Normal rate Pulmonary/chest: Effort normal Neurologic: Patient is alert Skin: Skin is warm Psychiatric: Patient has normal mood and affect    Ortho Exam: Orthopedic exam demonstrates  forward flexion to about 160.  Isolated glenohumeral abduction on the left is to 90.  External rotation at 15 degrees of abduction is about 35.  Cuff strength is excellent.  Not much in the way of pain with passive range of motion of that left arm.  Motor or sensory function to the left shoulder is intact.  Adding to the plan is for treatment of recurrent pain initially with anti-inflammatories and stretching.  If that does not help I recommend that he come in for an injection.  Specialty Comments:  No specialty comments available.  Imaging: No results found.   PMFS History: Patient Active Problem List   Diagnosis Date Noted  . Heart murmur   . Hypertension   . NSTEMI (non-ST elevated myocardial infarction) (HCC) 06/27/2014  . Dyslipidemia (high LDL; low HDL) 06/01/2014   Past Medical History:  Diagnosis Date  . CAD (coronary artery disease)    a. cath 06/28/2014 EF 50-55%, occluded posterior lateral branch with left to R collaterals, no PCI performed as patient was no longer symptomatic. Medically treated.  Marland Kitchen Dyslipidemia (high LDL; low HDL) 06/2014  . Heart murmur    a. aortic sclerosis seen on echo.  . Hypertension   . NSTEMI (non-ST elevated myocardial infarction) (HCC) 06/26/2014    Family History  Problem Relation Age  of Onset  . Heart disease Mother   . Diabetes Mother   . Hypertension Mother   . Kidney Stones Mother   . Hypertension Father   . Kidney Stones Father     Past Surgical History:  Procedure Laterality Date  . LEFT HEART CATHETERIZATION WITH CORONARY ANGIOGRAM N/A 06/28/2014   Procedure: LEFT HEART CATHETERIZATION WITH CORONARY ANGIOGRAM;  Surgeon: Runell GessJonathan J Berry, MD;  [facility];  Service: Cardiovascular;  Laterality: N/A;  . TONSILLECTOMY  ~ 1973   Social History   Occupational History  . Occupation: Emergency planning/management officerroject manager at [employer]  Tobacco Use  . Smoking status: Never Smoker  . Smokeless tobacco: Never Used  Substance and Sexual Activity    . Alcohol use: No  . Drug use: No  . Sexual activity: Yes

## 2018-01-09 ENCOUNTER — Other Ambulatory Visit: Payer: Self-pay | Admitting: Cardiovascular Disease

## 2018-01-09 DIAGNOSIS — E785 Hyperlipidemia, unspecified: Secondary | ICD-10-CM

## 2018-01-09 DIAGNOSIS — I2510 Atherosclerotic heart disease of native coronary artery without angina pectoris: Secondary | ICD-10-CM

## 2018-01-09 DIAGNOSIS — I10 Essential (primary) hypertension: Secondary | ICD-10-CM

## 2018-01-22 ENCOUNTER — Other Ambulatory Visit: Payer: Self-pay | Admitting: Cardiovascular Disease

## 2018-01-22 DIAGNOSIS — E785 Hyperlipidemia, unspecified: Secondary | ICD-10-CM

## 2018-01-22 DIAGNOSIS — I2510 Atherosclerotic heart disease of native coronary artery without angina pectoris: Secondary | ICD-10-CM

## 2018-01-22 DIAGNOSIS — I10 Essential (primary) hypertension: Secondary | ICD-10-CM

## 2018-02-14 ENCOUNTER — Other Ambulatory Visit: Payer: Self-pay | Admitting: Cardiovascular Disease

## 2018-02-14 DIAGNOSIS — I2510 Atherosclerotic heart disease of native coronary artery without angina pectoris: Secondary | ICD-10-CM

## 2018-02-14 DIAGNOSIS — I10 Essential (primary) hypertension: Secondary | ICD-10-CM

## 2018-02-14 DIAGNOSIS — E785 Hyperlipidemia, unspecified: Secondary | ICD-10-CM

## 2018-03-24 ENCOUNTER — Other Ambulatory Visit: Payer: Self-pay | Admitting: Cardiovascular Disease

## 2018-03-24 DIAGNOSIS — I2510 Atherosclerotic heart disease of native coronary artery without angina pectoris: Secondary | ICD-10-CM

## 2018-03-24 DIAGNOSIS — E785 Hyperlipidemia, unspecified: Secondary | ICD-10-CM

## 2018-03-24 DIAGNOSIS — I10 Essential (primary) hypertension: Secondary | ICD-10-CM

## 2018-12-14 ENCOUNTER — Telehealth: Payer: Self-pay

## 2018-12-14 NOTE — Telephone Encounter (Signed)
Called pt to set up possible evisit with SW on 12/16/2018, unable to leave message on either number listed on file.

## 2020-02-02 IMAGING — CR DG ORBITS FOR FOREIGN BODY
2 series · 2 of 2 positions shown · non-contrast
Comparison: None.

CLINICAL DATA: 51-year-old male with history of metal exposure to
the eyes. Planned MRI.

EXAM:
ORBITS FOR FOREIGN BODY - 2 VIEW

[w orbit pa (1 of 2)]
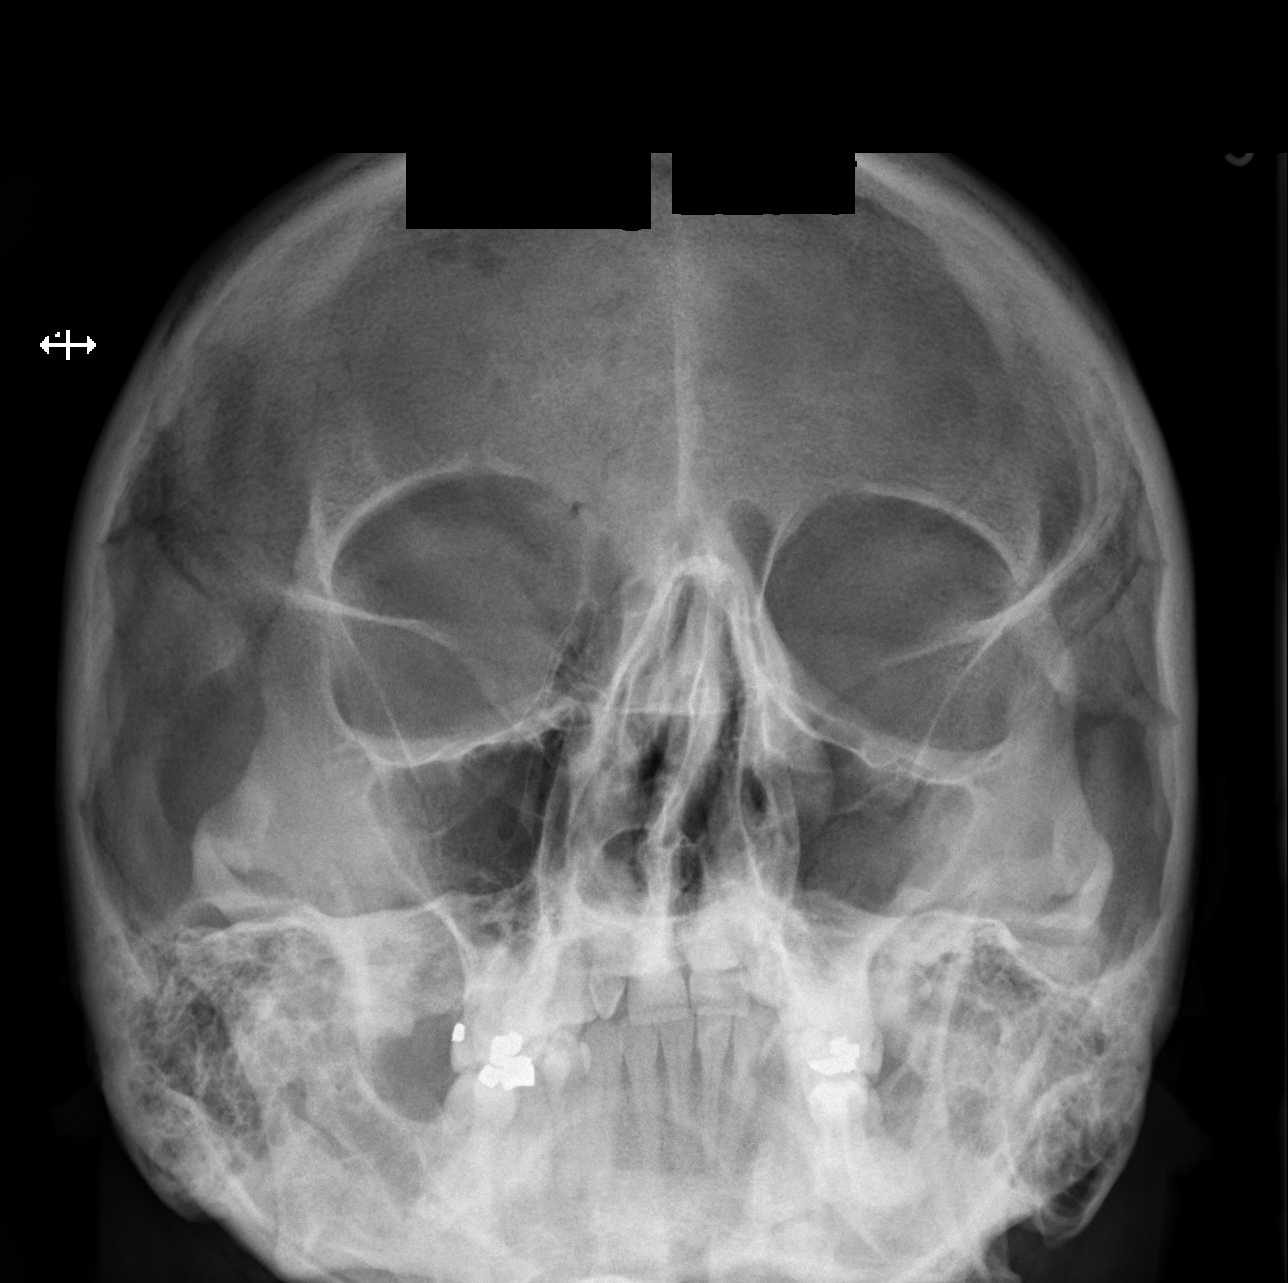

[w orbit pa (2 of 2)]
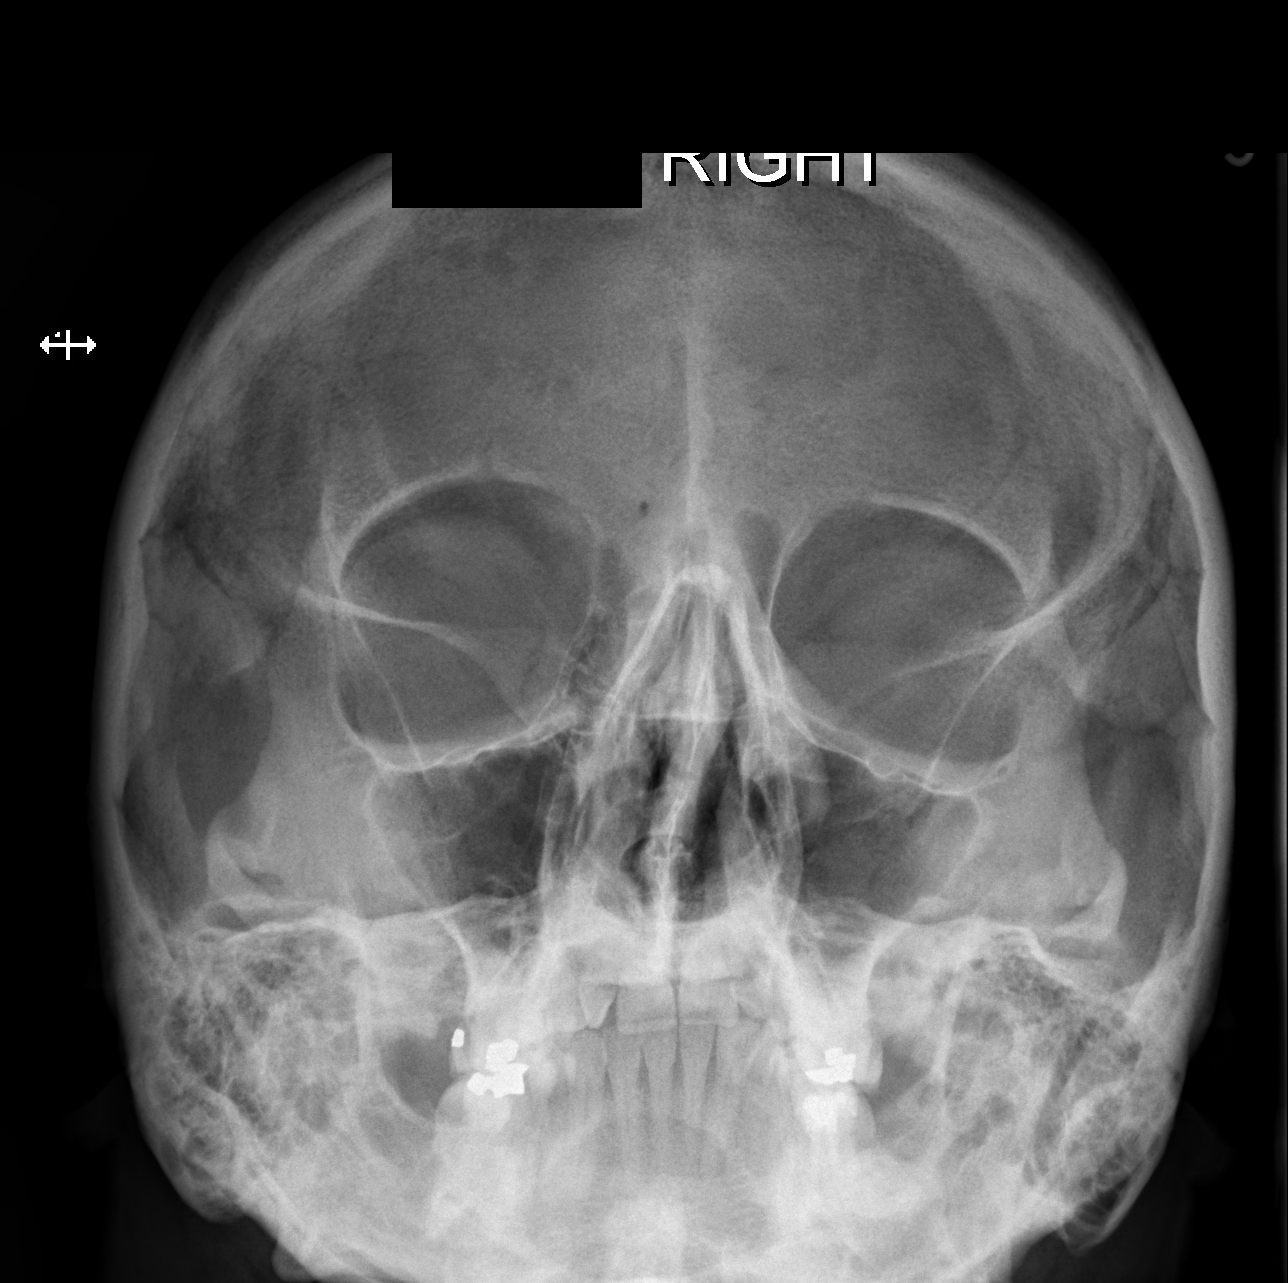

[2 of 2 positions shown; findings below may reference images not displayed]

FINDINGS: No radiopaque foreign body identified other than dental material.
Bone mineralization is within normal limits. The frontal sinuses are
hypoplastic. Otherwise the paranasal sinuses and mastoids appear
symmetrically pneumatized.
IMPRESSION: No radiopaque orbital foreign body.  May proceed to MRI.
